# Patient Record
Sex: Female | Born: 1975 | Race: White | Hispanic: No | Marital: Married | Smoking: Former smoker
Health system: Southern US, Community
[De-identification: ages and names within clinical notes are randomized; demographics above are authoritative.]

## PROBLEM LIST (undated history)

## (undated) DIAGNOSIS — Z9109 Other allergy status, other than to drugs and biological substances: Secondary | ICD-10-CM

## (undated) DIAGNOSIS — J45909 Unspecified asthma, uncomplicated: Secondary | ICD-10-CM

## (undated) HISTORY — DX: Morbid (severe) obesity due to excess calories: E66.01

---
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
## 2011-12-13 ENCOUNTER — Encounter: Payer: Self-pay | Admitting: *Deleted

## 2011-12-13 ENCOUNTER — Emergency Department
Admission: EM | Admit: 2011-12-13 | Discharge: 2011-12-13 | Disposition: A | Discharge status: Home / Self Care | Payer: BC Managed Care – PPO | Source: Home / Self Care

## 2011-12-13 DIAGNOSIS — N898 Other specified noninflammatory disorders of vagina: Secondary | ICD-10-CM

## 2011-12-13 DIAGNOSIS — K5900 Constipation, unspecified: Secondary | ICD-10-CM

## 2011-12-13 DIAGNOSIS — Z3200 Encounter for pregnancy test, result unknown: Secondary | ICD-10-CM

## 2011-12-13 DIAGNOSIS — R110 Nausea: Secondary | ICD-10-CM

## 2011-12-13 DIAGNOSIS — R350 Frequency of micturition: Secondary | ICD-10-CM

## 2011-12-13 HISTORY — DX: Unspecified asthma, uncomplicated: J45.909

## 2011-12-13 HISTORY — DX: Other allergy status, other than to drugs and biological substances: Z91.09

## 2011-12-13 LAB — POCT URINE PREGNANCY: Preg Test, Ur: NEGATIVE

## 2011-12-13 LAB — POCT URINALYSIS DIP (MANUAL ENTRY)
Leukocytes, UA: NEGATIVE
Nitrite, UA: NEGATIVE
Protein Ur, POC: NEGATIVE
Urobilinogen, UA: 0.2 (ref 0–1)
pH, UA: 7 (ref 5–8)

## 2011-12-13 NOTE — ED Notes (Signed)
The pt is here today for pregnancy testing. Her LMP was 10/25/11. She c/o nausea, breast tenderness, frequent urination and constipation on and off x 1 wk. Pt reports that she took a home pregnancy test on 12/11/2011 that was negative.

## 2011-12-13 NOTE — ED Provider Notes (Signed)
History     CSN: [id number]  Arrival date & time 12/13/11  4098   None     Chief Complaint  Patient presents with  . Possible Pregnancy      HPI Comments: The pt is here today for pregnancy testing. Her LMP was 10/25/11. She c/o nausea, breast tenderness, frequent urination and constipation on and off x 1 wk. Pt reports that she took a home pregnancy test on 12/11/2011 that was negative.  No pelvic or abdominal pain.  She notes that she had some mild vaginal spotting last night.  Patient is a 36 y.o. female presenting with vaginal bleeding. The history is provided by the patient.  Vaginal Bleeding This is a new problem. The current episode started yesterday. The problem occurs rarely. The problem has been resolved. Associated symptoms comments: none. Nothing aggravates the symptoms. Nothing relieves the symptoms. She has tried nothing for the symptoms.    Past Medical History  Diagnosis Date  . Asthma   . Environmental allergies     Past Surgical History  Procedure Date  . Foot surgery     Family History  Problem Relation Age of Onset  . Fibroids Mother   . Mental illness Father   . Fibroids Sister     History  Substance Use Topics  . Smoking status: Former Games developer  . Smokeless tobacco: Not on file  . Alcohol Use: No    OB History    Grav Para Term Preterm Abortions TAB SAB Ect Mult Living                  Review of Systems  Genitourinary: Positive for vaginal bleeding.  All other systems reviewed and are negative.    Allergies  Review of patient's allergies indicates no known allergies.  Home Medications   Current Outpatient Rx  Name Route Sig Dispense Refill  . ACETAMINOPHEN 325 MG PO TABS Oral Take 650 mg by mouth every 6 (six) hours as needed.    Marland Kitchen DIPHENHYDRAMINE HCL (SLEEP) 25 MG PO TABS Oral Take 25 mg by mouth at bedtime as needed.    Marland Kitchen PRENATAL 27-0.8 MG PO TABS Oral Take 1 tablet by mouth daily.      BP 128/84  Pulse 79  Temp 98.8 F  (37.1 C) (Oral)  Resp 16  Ht 5\' 7"  (1.702 m)  Wt 253 lb (114.76 kg)  BMI 39.63 kg/m2  SpO2 97%  LMP 10/25/2011  Physical Exam Nursing notes and Vital Signs reviewed. Appearance:  Patient appears stated age, and in no acute distress.  Patient is obese (BMI 39.6) Eyes:  Pupils are equal, round, and reactive to light and accomodation.  Extraocular movement is intact.  Conjunctivae are not inflamed  Pharynx:  Normal Neck:  Supple.   No adenopathy or thyromegaly Lungs:  Clear to auscultation.  Breath sounds are equal.  Heart:  Regular rate and rhythm without murmurs, rubs, or gallops.  Abdomen:  Nontender without masses or hepatosplenomegaly.  Bowel sounds are present.  No CVA or flank tenderness.  Extremities:  No edema.  No calf tenderness Skin:  No rash present.   ED Course  Procedures none   Labs Reviewed  POCT URINALYSIS DIP (MANUAL ENTRY) small blood, otherwise negative  POCT URINE PREGNANCY negative  HCG, SERUM, QUALITATIVE   Narrative:    Performed at:  [facility]                [address]  [place], [place] [address]      1. Encounter for pregnancy test       MDM  Will check HCG, serum, qualitative. Followup with Family Doctor or GYN         Lattie Haw, MD 12/15/11 [phone]

## 2011-12-30 ENCOUNTER — Ambulatory Visit (INDEPENDENT_AMBULATORY_CARE_PROVIDER_SITE_OTHER): Payer: BC Managed Care – PPO | Admitting: Obstetrics & Gynecology

## 2011-12-30 ENCOUNTER — Encounter: Payer: Self-pay | Admitting: Obstetrics & Gynecology

## 2011-12-30 VITALS — BP 118/75 | HR 86 | Ht 67.0 in | Wt 253.0 lb

## 2011-12-30 DIAGNOSIS — N915 Oligomenorrhea, unspecified: Secondary | ICD-10-CM

## 2011-12-30 DIAGNOSIS — E669 Obesity, unspecified: Secondary | ICD-10-CM

## 2011-12-30 DIAGNOSIS — N979 Female infertility, unspecified: Secondary | ICD-10-CM

## 2011-12-30 DIAGNOSIS — Z0000 Encounter for general adult medical examination without abnormal findings: Secondary | ICD-10-CM

## 2011-12-30 MED ORDER — DOXYCYCLINE HYCLATE 50 MG PO CAPS: ORAL_CAPSULE | ORAL | Status: DC

## 2011-12-30 NOTE — Patient Instructions (Signed)
Take Doxycycline starting one day before your HSG and continue twice a day for 3 days.  Call our office the day you start your next period, so the HSG can be scheduled.  Hysterosalpingography Hysterosalpingography is an X-ray exam in which a dye is injected into the cervix. This is done to view the inside of the uterus. This may help your caregiver determine whether you have uterine tumors, adhesions, or structural abnormalities. It may also help determine reasons for infertility. Complications from this test can include:  Infection in the lining of the fallopian tubes.   A perforation of the uterus.   An allergic reaction to the dye that is used to perform the X-ray.  PREPARATION FOR TEST No preparation or fasting is needed. However, tell the person performing the test if you think you may be pregnant.  NORMAL FINDINGS  Normal fallopian tubes.   No defects in the uterine cavity.  Ranges for normal findings may vary among different laboratories and hospitals. You should always check with your caregiver after having lab work or other tests done to discuss the meaning of your test results and whether your values are considered within normal limits. MEANING OF TEST  Your caregiver will go over the test results with you. Your caregiver will discuss the importance and meaning of your results. He or she will also discuss treatment options and additional tests, if needed. OBTAINING THE TEST RESULTS It is your responsibility to obtain your test results. Ask the lab or department performing the test when and how you will get your results. Document Released: 05/01/2004 Document Revised: 03/18/2011 Document Reviewed: 02/16/2011 [facility] Patient Information 2012 [place], [place]  .Semen Analysis This is a test used to learn about the health of your reproductive organs, particularly if your partner is having trouble becoming pregnant, or after a vasectomy to determine if the operation was successful.  Semen is the turbid, whitish substance that is released from the penis during ejaculation. Sperm are the cells in semen with a head and a tail that enables them to travel to the egg. A sperm contains one copy of each chromosome (all of the female's genes) and fuses with the female's egg, resulting in fertilization.  PREPARATION FOR TEST A semen sample will be collected in a sterile, wide-mouth container provided by the lab. NORMAL FINDINGS  Volume: 2-5 mL   Liquefaction time: 20 to 30 minutes after collection   pH: 7.12-8.00   Sperm count (density): 50-200 million/mL   Sperm motility: 60% to 80% actively motile   Sperm morphology: 70% to 90% normally shaped  Ranges for normal findings may vary among different laboratories and hospitals. You should always check with your doctor after having lab work or other tests done to discuss the meaning of your test results and whether your values are considered within normal limits. MEANING OF TEST  Your caregiver will go over the test results with you and discuss the importance and meaning of your results, as well as treatment options and the need for additional tests if necessary. OBTAINING THE TEST RESULTS It is your responsibility to obtain your test results. Ask the lab or department performing the test when and how you will get your results. Document Released: 04/23/2004 Document Revised: 03/18/2011 Document Reviewed: 03/10/2008 [facility] Patient Information 2012 [place], [place].

## 2011-12-30 NOTE — Progress Notes (Signed)
  Subjective:    Patient ID: Carrie Gibson, female    DOB: 1975/07/08, 36 y.o.   MRN: [id number]  HPI  Pt presents to establish care and worried that she was 3 weeks late for her last period.  Pt is extremely regular and has only missed the August period.  She has been trying to become pregnant for 6 yrs.  Neither her nor her husband have ever been pregnant.  Pt has not had any medical care.  Need FP and an annual exam.    Review of Systems  Constitutional: Negative.   Respiratory: Negative.   Cardiovascular: Negative.   Genitourinary: Negative.   Musculoskeletal: Negative.        Objective:   Physical Exam  Vitals reviewed. Constitutional: She is oriented to person, place, and time. She appears well-developed and well-nourished. No distress.  HENT:  Head: Normocephalic and atraumatic.  Eyes: Conjunctivae normal are normal.  Pulmonary/Chest: Effort normal.  Neurological: She is alert and oriented to person, place, and time.  Skin: Skin is warm and dry.  Psychiatric: She has a normal mood and affect.          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  Pt would like to proceed with infertility work up. 2-HSG with doxy for prophylaxis, Semen analysis 3-Chol panel, CBC, CMP for health maintenance 4-Refer to Dr. Velva Harman for establishing care 5-PAP and annual exam next visit.

## 2011-12-30 NOTE — Progress Notes (Signed)
Patient is here for irregular cycles, she is not sure when her last pap was.

## 2012-01-10 ENCOUNTER — Ambulatory Visit (INDEPENDENT_AMBULATORY_CARE_PROVIDER_SITE_OTHER): Payer: BC Managed Care – PPO | Admitting: Family Medicine

## 2012-01-10 ENCOUNTER — Encounter: Payer: Self-pay | Admitting: Family Medicine

## 2012-01-10 VITALS — BP 130/85 | HR 78 | Ht 66.0 in | Wt 255.0 lb

## 2012-01-10 DIAGNOSIS — Z8759 Personal history of other complications of pregnancy, childbirth and the puerperium: Secondary | ICD-10-CM | POA: Insufficient documentation

## 2012-01-10 DIAGNOSIS — Z23 Encounter for immunization: Secondary | ICD-10-CM

## 2012-01-10 DIAGNOSIS — Z0000 Encounter for general adult medical examination without abnormal findings: Secondary | ICD-10-CM

## 2012-01-10 DIAGNOSIS — Z8742 Personal history of other diseases of the female genital tract: Secondary | ICD-10-CM

## 2012-01-10 DIAGNOSIS — E669 Obesity, unspecified: Secondary | ICD-10-CM

## 2012-01-10 LAB — CBC
MCH: 29.8 pg (ref 26.0–34.0)
MCHC: 34.3 g/dL (ref 30.0–36.0)
MCV: 86.9 fL (ref 78.0–100.0)
Platelets: 272 10*3/uL (ref 150–400)
RBC: 4.87 MIL/uL (ref 3.87–5.11)
RDW: 12 % (ref 11.5–15.5)

## 2012-01-10 LAB — LIPID PANEL
Cholesterol: 204 mg/dL — ABNORMAL HIGH (ref 0–200)
LDL Cholesterol: 141 mg/dL — ABNORMAL HIGH (ref 0–99)
Total CHOL/HDL Ratio: 6.6 Ratio
VLDL: 32 mg/dL (ref 0–40)

## 2012-01-10 LAB — COMPREHENSIVE METABOLIC PANEL
ALT: 12 U/L (ref 0–35)
Alkaline Phosphatase: 67 U/L (ref 39–117)
CO2: 23 mEq/L (ref 19–32)
Creat: 0.7 mg/dL (ref 0.50–1.10)
Sodium: 140 mEq/L (ref 135–145)
Total Bilirubin: 0.5 mg/dL (ref 0.3–1.2)

## 2012-01-10 NOTE — Progress Notes (Signed)
CC: Carrie Gibson is a 36 y.o. female is here for Establish Care and lab work   Subjective: HPI:  Patient presents to establish care  She states that she and her husband have been trying to get pregnant for matter of years. During this time her periods have been predictable and regular. She describes missing. Back in August in 2-3 weeks at the expected first administration she had heavy bleeding and abnormal lower pelvic resting. She was seen by our GYN colleagues in their suspicion that she may miscarriage. She's not had any vaginal bleeding since this event up until yesterday, character bleeding is similar to past regular periods. She denies shortness of breath, abnormal bleeding histories, chest pain, irregular heartbeat, nor easy bruisability.  She has never had her cholesterol checked. She exercises 3 times a week and tries to watch what she eats.  She is a family history of a benign thyroid tumor but no thyroid abnormalities but she does not believe that her thyroid has ever been checked. She believes one of her grandparents may have diabetes she's never been screened for diabetes from what she knows. She denies polyphagia, polyuria, polydipsia, nor poorly healing wounds.    Review Of Systems Outlined In HPI  Past Medical History  Diagnosis Date  . Asthma   . Environmental allergies      Family History  Problem Relation Age of Onset  . Fibroids Mother   . Mental illness Father   . Fibroids Sister      History  Substance Use Topics  . Smoking status: Former Games developer  . Smokeless tobacco: Not on file  . Alcohol Use: No     Objective: Filed Vitals:   01/10/12 0836  BP: 130/85  Pulse: 78    General: Alert and Oriented, No Acute Distress HEENT: Pupils equal, round, reactive to light. Conjunctivae clear.  External ears unremarkable, canals clear with intact TMs with appropriate landmarks.  Middle ear appears open without effusion. Pink inferior turbinates.  Moist mucous  membranes, pharynx without inflammation nor lesions.  Neck supple without palpable lymphadenopathy nor abnormal masses. Lungs: Clear to auscultation bilaterally, no wheezing/ronchi/rales.  Comfortable work of breathing. Good air movement. Cardiac: Regular rate and rhythm. Normal S1/S2.  No murmurs, rubs, nor gallops.   Extremities: No peripheral edema.  Strong peripheral pulses.  Mental Status: No depression, anxiety, nor agitation. Skin: Warm and dry.  Assessment & Plan: Kamelia was seen today for establish care and lab work.  Diagnoses and associated orders for this visit:  Obesity - Comp Met (CMET) - Lipid panel - TSH  History of miscarriage - CBC  Routine health maintenance - Comp Met (CMET) - Lipid panel - TSH - Hepatitis A vaccine adult IM    Screen for dyslipidemia, screen for diabetes, screen for thyroid abnormalities, screen for metabolic abnormalities given recent history possible miscarriage. CBC given recent abnormal menstruation, checking to insure platelets are appropriate and not anemic. She declines flu shot today but believes she's never had hepatitis A vaccine and specifically requests that. Follow up will be based on above results at least within 6 months for #2 hepatitis a.  She's continue infertility workup with our GYN colleagues.  Return in about 6 months (around 07/09/2012).

## 2012-01-11 ENCOUNTER — Encounter: Payer: Self-pay | Admitting: Family Medicine

## 2012-01-17 ENCOUNTER — Encounter: Payer: Self-pay | Admitting: Obstetrics & Gynecology

## 2012-01-17 ENCOUNTER — Ambulatory Visit (HOSPITAL_COMMUNITY)
Admission: RE | Admit: 2012-01-17 | Discharge: 2012-01-17 | Disposition: A | Discharge status: Home / Self Care | Payer: BC Managed Care – PPO | Source: Ambulatory Visit | Attending: Obstetrics & Gynecology | Admitting: Obstetrics & Gynecology

## 2012-01-17 DIAGNOSIS — N979 Female infertility, unspecified: Secondary | ICD-10-CM | POA: Insufficient documentation

## 2012-01-17 MED ORDER — IOHEXOL 300 MG/ML  SOLN
10.0000 mL | Freq: Once | INTRAMUSCULAR | Status: AC | PRN
  Administered 2012-01-17: 10 mL

## 2012-04-13 ENCOUNTER — Encounter: Payer: Self-pay | Admitting: Physician Assistant

## 2012-04-13 ENCOUNTER — Ambulatory Visit (INDEPENDENT_AMBULATORY_CARE_PROVIDER_SITE_OTHER): Payer: BC Managed Care – PPO | Admitting: Physician Assistant

## 2012-04-13 VITALS — BP 143/88 | HR 83 | Temp 98.6°F | Resp 18 | Wt 201.0 lb

## 2012-04-13 DIAGNOSIS — J45901 Unspecified asthma with (acute) exacerbation: Secondary | ICD-10-CM | POA: Insufficient documentation

## 2012-04-13 DIAGNOSIS — J40 Bronchitis, not specified as acute or chronic: Secondary | ICD-10-CM

## 2012-04-13 DIAGNOSIS — J45909 Unspecified asthma, uncomplicated: Secondary | ICD-10-CM | POA: Insufficient documentation

## 2012-04-13 MED ORDER — ALBUTEROL SULFATE HFA 108 (90 BASE) MCG/ACT IN AERS: 2.0000 | INHALATION_SPRAY | Freq: Four times a day (QID) | RESPIRATORY_TRACT | Status: DC | PRN

## 2012-04-13 MED ORDER — AZITHROMYCIN 250 MG PO TABS: ORAL_TABLET | ORAL | Status: DC

## 2012-04-13 MED ORDER — PREDNISONE 20 MG PO TABS: 20.0000 mg | ORAL_TABLET | Freq: Every day | ORAL | Status: DC

## 2012-04-13 MED ORDER — PREDNISONE 20 MG PO TABS: 20.0000 mg | ORAL_TABLET | Freq: Two times a day (BID) | ORAL | Status: DC

## 2012-04-13 NOTE — Patient Instructions (Addendum)

## 2012-04-13 NOTE — Progress Notes (Signed)
  Subjective:    Patient ID: Carrie Gibson, female    DOB: Jul 30, 1975, 37 y.o.   MRN: [id number]  HPI Patient presents to the clinic with cough, SOB, and wheezing for 4-5 days. She has a hx of asthma but has not needed treatment over the past years. Her chest really feels tight. She has a productive cough. She denies any fever, chills, muscle aches, N/V/D. She has some ear congestion but denies any ST. She does have some sinus congestion. She has tried OTC inhaler medication and decongestants. She feels very out of breath.   Review of Systems     Objective:   Physical Exam  Constitutional: She is oriented to person, place, and time. She appears well-developed and well-nourished.  HENT:  Head: Normocephalic and atraumatic.  Right Ear: External ear normal.  Left Ear: External ear normal.  Nose: Nose normal.  Mouth/Throat: Oropharynx is clear and moist.       TM's clear bilaterally.   Negative for any sinus pressure.   Eyes: Conjunctivae normal are normal.  Neck: Normal range of motion. Neck supple.  Cardiovascular: Normal rate, regular rhythm and normal heart sounds.   Pulmonary/Chest:       Decreased effort with deep breaths. Did hear inspiratory wheezing at apex of both lungs. Not able to finish complete sentences without taking breaths in between.   Lymphadenopathy:    She has no cervical adenopathy.  Neurological: She is alert and oriented to person, place, and time.  Skin: Skin is warm and dry.  Psychiatric: She has a normal mood and affect. Her behavior is normal.          Assessment & Plan:  Bronchitis with asthma component- Gave neb in office. Pt admitted to feeling better after neb. Gave prednisone for 5 days. Gave inhaler to use as needed up to every 6 hours. Discussed with patient if still needed neb after 2 weeks need to be seen in office to discuss daily ICS. Gave zpak to start.symptomatic care discussed.

## 2012-04-14 ENCOUNTER — Ambulatory Visit: Payer: BC Managed Care – PPO | Admitting: Physician Assistant

## 2012-05-27 ENCOUNTER — Other Ambulatory Visit: Payer: Self-pay

## 2012-05-29 ENCOUNTER — Ambulatory Visit (INDEPENDENT_AMBULATORY_CARE_PROVIDER_SITE_OTHER): Payer: BC Managed Care – PPO | Admitting: Family Medicine

## 2012-05-29 VITALS — BP 145/83 | HR 102 | Temp 98.1°F | Wt 265.0 lb

## 2012-05-29 DIAGNOSIS — J209 Acute bronchitis, unspecified: Secondary | ICD-10-CM

## 2012-05-29 DIAGNOSIS — J45901 Unspecified asthma with (acute) exacerbation: Secondary | ICD-10-CM

## 2012-05-29 MED ORDER — MOMETASONE FUROATE 220 MCG/INH IN AEPB: 1.0000 | INHALATION_SPRAY | Freq: Every day | RESPIRATORY_TRACT | Status: DC

## 2012-05-29 MED ORDER — CEFDINIR 300 MG PO CAPS: 300.0000 mg | ORAL_CAPSULE | Freq: Two times a day (BID) | ORAL | Status: DC

## 2012-05-29 MED ORDER — ALBUTEROL SULFATE (2.5 MG/3ML) 0.083% IN NEBU: 2.5000 mg | INHALATION_SOLUTION | Freq: Four times a day (QID) | RESPIRATORY_TRACT | Status: DC | PRN

## 2012-05-29 MED ORDER — PREDNISONE 20 MG PO TABS: ORAL_TABLET | ORAL | Status: AC

## 2012-05-29 NOTE — Progress Notes (Signed)
CC: Carrie Gibson is a 37 y.o. female is here for Nasal Congestion and Cough   Subjective: HPI:  Patient complains of shortness of breath and wheezing for the past 4-5 days. For the past 48 hours she has felt subjective fevers and chills. Temperature at home yesterday was 100.0. She describes her shortness of breath has trouble breathing even when resting and speaking full sentences. Anything with more exertion worsening shortness of breath during.  All her symptoms seem to have started after she gave her daughter back which has caused shortness of breath and worsening asthma in the past. Shortness of breath and wheezing are somewhat improved with albuterol home. Nothing else makes symptoms better or worse. She recalls using a controller inhaler years ago but none recently.  Symptoms are present 24 hours a day, do not interfere asleep. She describes her overall constellation of symptoms as moderate in severity. She denies confusion, headache, motor sensory disturbances, chest pain, nausea, vomiting, orthopnea, nor peripheral edema.  Review Of Systems Outlined In HPI  Past Medical History  Diagnosis Date  . Asthma   . Environmental allergies      Family History  Problem Relation Age of Onset  . Fibroids Mother   . Mental illness Father   . Fibroids Sister      History  Substance Use Topics  . Smoking status: Former Games developer  . Smokeless tobacco: Not on file  . Alcohol Use: No     Objective: Filed Vitals:   05/29/12 1346  BP: 145/83  Pulse: 102  Temp: 98.1 F (36.7 C)    General: Alert and Oriented, No Acute Distress HEENT: Pupils equal, round, reactive to light. Conjunctivae clear.  External ears unremarkable, canals clear with intact TMs with appropriate landmarks.  Middle ear appears open without effusion. Pink inferior turbinates.  Moist mucous membranes, pharynx without inflammation nor lesions.  Neck supple without palpable lymphadenopathy nor abnormal masses. Lungs: Mild  wheezing in all lung fields without rhonchi nor rales. No signs of consolidation. Patient speaking comfortably with full sentences Cardiac: Regular rate and rhythm. Normal S1/S2.  No murmurs, rubs, nor gallops.   Extremities: No peripheral edema.  Strong peripheral pulses.  Mental Status: No depression, anxiety, nor agitation. Skin: Warm and dry.  Assessment & Plan: Carrie Gibson was seen today for nasal congestion and cough.  Diagnoses and associated orders for this visit:  Acute bronchitis - cefdinir (OMNICEF) 300 MG capsule; Take 1 capsule (300 mg total) by mouth 2 (two) times daily. - predniSONE (DELTASONE) 20 MG tablet; Three tabs at once daily for five days. - albuterol (PROVENTIL) (2.5 MG/3ML) 0.083% nebulizer solution; Take 3 mLs (2.5 mg total) by nebulization every 6 (six) hours as needed for wheezing.  Asthma with acute exacerbation - albuterol (PROVENTIL) (2.5 MG/3ML) 0.083% nebulizer solution; Take 3 mLs (2.5 mg total) by nebulization every 6 (six) hours as needed for wheezing. - mometasone (ASMANEX) 220 MCG/INH inhaler; Inhale 1 puff into the lungs daily.    Asthma exacerbation: Likely do to dog dander exposure and viral/bacterial bronchitis. I like her to start prednisone and either azithromycin or doxycycline. She refuses azithromycin and is currently trying to get pregnant therefore will treat with second line therapy of Omnicef. She has a nebulizer at home was provided with albuterol. Her symptoms were greatly improved subjectively and objectively after duoneb administration.  She was given Asmanex sample and encouraged to start this while finishing prednisone. Call me if helping any baseline shortness of breathing and I will call  in a refill.  25 minutes spent face-to-face during visit today of which at least 50% was counseling or coordinating care regarding asthma and acute bronchitis.   Return if symptoms worsen or fail to improve.

## 2012-06-16 ENCOUNTER — Encounter: Payer: Self-pay | Admitting: Family Medicine

## 2012-06-16 DIAGNOSIS — J45909 Unspecified asthma, uncomplicated: Secondary | ICD-10-CM

## 2012-06-19 MED ORDER — MOMETASONE FUROATE 220 MCG/INH IN AEPB: 1.0000 | INHALATION_SPRAY | Freq: Every day | RESPIRATORY_TRACT | Status: DC

## 2012-06-19 NOTE — Telephone Encounter (Signed)
Carrie Gibson, Will you please let Carrie Gibson know that i've sent in refills on the asmanex inhaler to her pharmacy. I'd like her to follow up in one month.

## 2012-08-31 ENCOUNTER — Telehealth: Payer: Self-pay | Admitting: Family Medicine

## 2012-08-31 DIAGNOSIS — J45909 Unspecified asthma, uncomplicated: Secondary | ICD-10-CM

## 2012-08-31 MED ORDER — BECLOMETHASONE DIPROPIONATE 80 MCG/ACT IN AERS: INHALATION_SPRAY | RESPIRATORY_TRACT | Status: DC

## 2012-08-31 NOTE — Telephone Encounter (Signed)
Left message on vm

## 2012-08-31 NOTE — Telephone Encounter (Signed)
Sue Lush, Will you please let Mrs. Walthall know that her medication insurance plan will not cover asthmanex.  This is not that big of a deal since there are great alternatives that will work just as well. (QVAR/ Flovent HFA/Diskus, Pulmicort).  I have sent a QVAR rx to [facility] on [address] as a substitute.

## 2012-10-31 ENCOUNTER — Telehealth: Payer: Self-pay | Admitting: Family Medicine

## 2012-10-31 DIAGNOSIS — J309 Allergic rhinitis, unspecified: Secondary | ICD-10-CM

## 2012-10-31 MED ORDER — MOMETASONE FUROATE 50 MCG/ACT NA SUSP: NASAL | Status: DC

## 2012-10-31 NOTE — Telephone Encounter (Signed)
nasacort no help

## 2013-01-15 ENCOUNTER — Emergency Department
Admission: EM | Admit: 2013-01-15 | Discharge: 2013-01-15 | Disposition: A | Discharge status: Home / Self Care | Payer: BC Managed Care – PPO | Source: Home / Self Care | Attending: Family Medicine | Admitting: Family Medicine

## 2013-01-15 DIAGNOSIS — J069 Acute upper respiratory infection, unspecified: Secondary | ICD-10-CM

## 2013-01-15 DIAGNOSIS — J029 Acute pharyngitis, unspecified: Secondary | ICD-10-CM

## 2013-01-15 MED ORDER — PREDNISONE 50 MG PO TABS: ORAL_TABLET | ORAL | Status: DC

## 2013-01-15 NOTE — ED Notes (Signed)
Patient complains of sore throat, congestion, hoarseness and bilateral ear pain for 2 days.

## 2013-01-15 NOTE — ED Provider Notes (Addendum)
CSN: [id number]     Arrival date & time 01/15/13  1706 History   First MD Initiated Contact with Patient 01/15/13 1723     No chief complaint on file.   HPI  URI Symptoms Onset: 2 days  Description: rhinorrhea, nasal congestion, ?fluid in ears  Modifying factors:  + strep exposure in niece  Symptoms Nasal discharge: yes Fever: no Sore throat: yes Cough: no Wheezing: no Ear pain: no GI symptoms: no Sick contacts: yes  Red Flags  Stiff neck: no Dyspnea: no Rash: no Swallowing difficulty: no  Sinusitis Risk Factors Headache/face pain: no Double sickening: no tooth pain: no  Allergy Risk Factors Sneezing: no Itchy scratchy throat: no Seasonal symptoms: no  Flu Risk Factors Headache: no muscle aches: no severe fatigue: no   Past Medical History  Diagnosis Date  . Asthma   . Environmental allergies    Past Surgical History  Procedure Laterality Date  . Foot surgery  1995   Family History  Problem Relation Age of Onset  . Fibroids Mother   . Mental illness Father   . Fibroids Sister    History  Substance Use Topics  . Smoking status: Former Games developer  . Smokeless tobacco: Not on file  . Alcohol Use: No   OB History   Grav Para Term Preterm Abortions TAB SAB Ect Mult Living                 Review of Systems  All other systems reviewed and are negative.    Allergies  Review of patient's allergies indicates no known allergies.  Home Medications   Current Outpatient Rx  Name  Route  Sig  Dispense  Refill  . albuterol (PROVENTIL HFA;VENTOLIN HFA) 108 (90 BASE) MCG/ACT inhaler   Inhalation   Inhale 2 puffs into the lungs every 6 (six) hours as needed for wheezing.   1 Inhaler   2   . albuterol (PROVENTIL) (2.5 MG/3ML) 0.083% nebulizer solution   Nebulization   Take 3 mLs (2.5 mg total) by nebulization every 6 (six) hours as needed for wheezing.   75 mL   1   . beclomethasone (QVAR) 80 MCG/ACT inhaler      Two puffs inhaled twice a day to  prevent asthma symptoms.   7.3 g   12   . cefdinir (OMNICEF) 300 MG capsule   Oral   Take 1 capsule (300 mg total) by mouth 2 (two) times daily.   20 capsule   0   . cetirizine (ZYRTEC) 10 MG tablet   Oral   Take 10 mg by mouth daily.         . mometasone (NASONEX) 50 MCG/ACT nasal spray      Two sprays each nostril daily.   17 g   2    There were no vitals taken for this visit. Physical Exam  Constitutional:  Obese    HENT:  Head: Normocephalic and atraumatic.  Right Ear: External ear normal.  Left Ear: External ear normal.  Mouth/Throat: No oropharyngeal exudate.  +nasal erythema, rhinorrhea bilaterally, + post oropharyngeal erythema    Eyes: Conjunctivae are normal. Pupils are equal, round, and reactive to light.  Neck: Normal range of motion. Neck supple.  Cardiovascular: Normal rate, regular rhythm and normal heart sounds.   Pulmonary/Chest: Effort normal and breath sounds normal.  Abdominal: Soft.  Musculoskeletal: Normal range of motion.  Lymphadenopathy:    She has cervical adenopathy.  Neurological: She is alert.  Skin: Skin  is warm.    ED Course  Procedures (including critical care time) Labs Review Labs Reviewed - No data to display Imaging Review No results found.  MDM   1. URI (upper respiratory infection)    Likley viral process Rapid strep negative.  No wheezing today.  Lower threshold for prednisone if needed.  Will call this in as ppx measure.  Strep culture.  Discussed supportive care and infectious red flags.      The patient and/or caregiver has been counseled thoroughly with regard to treatment plan and/or medications prescribed including dosage, schedule, interactions, rationale for use, and possible side effects and they verbalize understanding. Diagnoses and expected course of recovery discussed and will return if not improved as expected or if the condition worsens. Patient and/or caregiver verbalized understanding.           Doree Albee, MD 01/15/13 1757  Doree Albee, MD 01/15/13 Windy Fast

## 2013-01-16 ENCOUNTER — Encounter: Payer: Self-pay | Admitting: Family Medicine

## 2013-01-17 LAB — STREP A DNA PROBE: GASP: NEGATIVE

## 2013-01-18 ENCOUNTER — Telehealth: Payer: Self-pay | Admitting: *Deleted

## 2013-01-24 ENCOUNTER — Encounter: Payer: Self-pay | Admitting: Family Medicine

## 2013-02-15 ENCOUNTER — Other Ambulatory Visit: Payer: Self-pay

## 2013-03-09 ENCOUNTER — Encounter: Payer: Self-pay | Admitting: Family Medicine

## 2013-03-12 MED ORDER — ALBUTEROL SULFATE HFA 108 (90 BASE) MCG/ACT IN AERS: INHALATION_SPRAY | RESPIRATORY_TRACT | Status: DC

## 2013-05-28 ENCOUNTER — Encounter: Payer: Self-pay | Admitting: Family Medicine

## 2013-05-28 ENCOUNTER — Ambulatory Visit (INDEPENDENT_AMBULATORY_CARE_PROVIDER_SITE_OTHER): Payer: BC Managed Care – PPO | Admitting: Family Medicine

## 2013-05-28 VITALS — BP 133/84 | HR 78 | Ht 66.0 in | Wt 282.0 lb

## 2013-05-28 DIAGNOSIS — J45909 Unspecified asthma, uncomplicated: Secondary | ICD-10-CM

## 2013-05-28 DIAGNOSIS — Z23 Encounter for immunization: Secondary | ICD-10-CM

## 2013-05-28 DIAGNOSIS — Z0289 Encounter for other administrative examinations: Secondary | ICD-10-CM

## 2013-05-28 MED ORDER — ALBUTEROL SULFATE HFA 108 (90 BASE) MCG/ACT IN AERS: 2.0000 | INHALATION_SPRAY | Freq: Four times a day (QID) | RESPIRATORY_TRACT | Status: DC | PRN

## 2013-05-28 MED ORDER — BECLOMETHASONE DIPROPIONATE 80 MCG/ACT IN AERS: INHALATION_SPRAY | RESPIRATORY_TRACT | Status: DC

## 2013-05-28 NOTE — Progress Notes (Signed)
CC: Carrie Gibson is a 38 y.o. female is here for form completed for work   Subjective: HPI:  Recently became employed in a teaching position with [employer] requesting health form to be filled out today. She reports that she received MMR vaccine as a child and then again in college. She has not had the flu shot this year she is 10 years out since her last tetanus booster. She is never been exposed to tuberculosis that she knows of. She had a normal PPD 3 years ago since then denies any new cough, unintentional weight loss, night sweats, nor ever having blood in sputum. She has never been any high risk tuberculosis exposure setting.  Her only chronic illnesses asthma she is currently using Qvar on a daily basis and uses her rescue inhaler only if she is battling a virus or has been exposed to tomatoes.    Denies fevers, chills, shortness of breath, wheezing, rash, weakness, joint or muscle pain   Review Of Systems Outlined In HPI  Past Medical History  Diagnosis Date  . Asthma   . Environmental allergies     Past Surgical History  Procedure Laterality Date  . Foot surgery  1995   Family History  Problem Relation Age of Onset  . Fibroids Mother   . Mental illness Father   . Fibroids Sister     History   Social History  . Marital Status: Married    Spouse Name: N/A    Number of Children: N/A  . Years of Education: N/A   Occupational History  . Not on file.   Social History Main Topics  . Smoking status: Former Research scientist (life sciences)  . Smokeless tobacco: Not on file  . Alcohol Use: No  . Drug Use: No  . Sexual Activity:    Other Topics Concern  . Not on file   Social History Narrative  . No narrative on file     Objective: BP 133/84  Pulse 78  Ht _0  (1.676 m)  Wt 282 lb (127.914 kg)  BMI 45.54 kg/m2  General: Alert and Oriented, No Acute Distress HEENT: Pupils equal, round, reactive to light. Conjunctivae clear.  External ears unremarkable, canals clear with  intact TMs with appropriate landmarks.  Middle ear appears open without effusion. Pink inferior turbinates.  Moist mucous membranes, pharynx without inflammation nor lesions.  Neck supple without palpable lymphadenopathy nor abnormal masses. Lungs: Clear to auscultation bilaterally, no wheezing/ronchi/rales.  Comfortable work of breathing. Good air movement. Cardiac: Regular rate and rhythm. Normal S1/S2.  No murmurs, rubs, nor gallops.   Extremities: No peripheral edema.  Strong peripheral pulses.  Mental Status: No depression, anxiety, nor agitation. Skin: Warm and dry.  Assessment & Plan: Carrie Gibson was seen today for form completed for work.  Diagnoses and associated orders for this visit:  Asthma, chronic - beclomethasone (QVAR) 80 MCG/ACT inhaler; Two puffs inhaled twice a day to prevent asthma symptoms.  Examination, physical, employee  Other Orders - albuterol (PROVENTIL HFA;VENTOLIN HFA) 108 (90 BASE) MCG/ACT inhaler; Inhale 2 puffs into the lungs every 6 (six) hours as needed for wheezing.    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   Return if symptoms worsen or fail to improve.

## 2013-05-28 NOTE — Addendum Note (Signed)
Addended by: Isaias Cowman C on: 05/28/2013 11:35 AM   Modules accepted: Orders

## 2013-05-30 ENCOUNTER — Encounter: Payer: Self-pay | Admitting: Family Medicine

## 2013-05-30 ENCOUNTER — Ambulatory Visit (INDEPENDENT_AMBULATORY_CARE_PROVIDER_SITE_OTHER): Payer: BC Managed Care – PPO | Admitting: Family Medicine

## 2013-05-30 VITALS — BP 131/84 | HR 77 | Temp 97.8°F | Wt 282.0 lb

## 2013-05-30 DIAGNOSIS — J45901 Unspecified asthma with (acute) exacerbation: Secondary | ICD-10-CM

## 2013-05-30 DIAGNOSIS — J45909 Unspecified asthma, uncomplicated: Secondary | ICD-10-CM

## 2013-05-30 MED ORDER — PREDNISONE 20 MG PO TABS: ORAL_TABLET | ORAL | Status: AC

## 2013-05-30 MED ORDER — AZITHROMYCIN 250 MG PO TABS: ORAL_TABLET | ORAL | Status: AC

## 2013-05-30 MED ORDER — BENZONATATE 100 MG PO CAPS: 100.0000 mg | ORAL_CAPSULE | Freq: Three times a day (TID) | ORAL | Status: DC | PRN

## 2013-05-30 NOTE — Progress Notes (Signed)
CC: Carrie Gibson is a 38 y.o. female is here for cough and congestion   Subjective: HPI:  Complains of productive cough that has been present since late last night with acute onset moderate in severity persistent causing shortness of breath of moderate severity. Slightly improves with albuterol use. Has not been getting better or worse since onset. Denies choking or known aspiration.  Denies fevers, chills, confusion, wheezing, chest pain, nasal congestion or sinus pressure. Symptoms are persistent throughout the day   Review Of Systems Outlined In HPI  Past Medical History  Diagnosis Date  . Asthma   . Environmental allergies     Past Surgical History  Procedure Laterality Date  . Foot surgery  1995   Family History  Problem Relation Age of Onset  . Fibroids Mother   . Mental illness Father   . Fibroids Sister     History   Social History  . Marital Status: Married    Spouse Name: N/A    Number of Children: N/A  . Years of Education: N/A   Occupational History  . Not on file.   Social History Main Topics  . Smoking status: Former Research scientist (life sciences)  . Smokeless tobacco: Not on file  . Alcohol Use: No  . Drug Use: No  . Sexual Activity:    Other Topics Concern  . Not on file   Social History Narrative  . No narrative on file     Objective: BP 131/84  Pulse 77  Temp(Src) 97.8 F (36.6 C) (Oral)  Wt 282 lb (127.914 kg)  General: Alert and Oriented, No Acute Distress HEENT: Pupils equal, round, reactive to light. Conjunctivae clear.  External ears unremarkable, canals clear with intact TMs with appropriate landmarks.  Middle ear appears open without effusion. Pink inferior turbinates.  Moist mucous membranes, pharynx without inflammation nor lesions.  Neck supple without palpable lymphadenopathy nor abnormal masses. Lungs: Clear to auscultation bilaterally, no wheezing/ronchi/rales.  Comfortable work of breathing. Good air movement. Frequent coughing Cardiac: Regular rate  and rhythm. Normal S1/S2.  No murmurs, rubs, nor gallops.   Mental Status: No depression, anxiety, nor agitation. Skin: Warm and dry.  Assessment & Plan: Carrie Gibson was seen today for cough and congestion.  Diagnoses and associated orders for this visit:  Asthma with acute exacerbation - predniSONE (DELTASONE) 20 MG tablet; Three tabs at once daily for five days. - benzonatate (TESSALON PERLES) 100 MG capsule; Take 1 capsule (100 mg total) by mouth 3 (three) times daily as needed for cough.  Asthma  Other Orders - azithromycin (ZITHROMAX) 250 MG tablet; Take two tabs at once on day 1, then one tab daily on days 2-5.    Asthma exacerbation due to viral respiratory illness start prednisone and Tessalon Perles if no improvement after 48 hours start azithromycin however no clear indication to have this filled at this time   Return if symptoms worsen or fail to improve.

## 2013-07-17 ENCOUNTER — Encounter: Payer: Self-pay | Admitting: Family Medicine

## 2013-07-17 ENCOUNTER — Ambulatory Visit (INDEPENDENT_AMBULATORY_CARE_PROVIDER_SITE_OTHER): Payer: BC Managed Care – PPO | Admitting: Family Medicine

## 2013-07-17 VITALS — BP 137/88 | HR 82 | Wt 288.0 lb

## 2013-07-17 DIAGNOSIS — B9689 Other specified bacterial agents as the cause of diseases classified elsewhere: Secondary | ICD-10-CM

## 2013-07-17 DIAGNOSIS — H1089 Other conjunctivitis: Secondary | ICD-10-CM

## 2013-07-17 DIAGNOSIS — J309 Allergic rhinitis, unspecified: Secondary | ICD-10-CM

## 2013-07-17 DIAGNOSIS — A499 Bacterial infection, unspecified: Secondary | ICD-10-CM

## 2013-07-17 DIAGNOSIS — H109 Unspecified conjunctivitis: Secondary | ICD-10-CM

## 2013-07-17 MED ORDER — MOMETASONE FUROATE 50 MCG/ACT NA SUSP: NASAL | Status: DC

## 2013-07-17 MED ORDER — POLYMYXIN B-TRIMETHOPRIM 10000-0.1 UNIT/ML-% OP SOLN: 2.0000 [drp] | OPHTHALMIC | Status: DC

## 2013-07-17 NOTE — Progress Notes (Signed)
CC: Carrie Gibson is a 38 y.o. female is here for red irritated eye   Subjective: HPI:  Left eye pain and redness for the past 36 hours came on gradually has not been getting better or worse throughout the day. Pain described as itching mild to moderate in severity nothing particularly makes better or worse. She has tried allergy eye drops without any improvement of her discomfort. Denies pain with blinking or movement of the eye. Denies photophobia. Denies vision loss. The eye was matted shut this morning.  Pain is localized only to lateral aspect of it. She denies known exposure to airborne foreign bodies. Denies fevers, chills, nor headache   Review Of Systems Outlined In HPI  Past Medical History  Diagnosis Date  . Asthma   . Environmental allergies     Past Surgical History  Procedure Laterality Date  . Foot surgery  1995   Family History  Problem Relation Age of Onset  . Fibroids Mother   . Mental illness Father   . Fibroids Sister     History   Social History  . Marital Status: Married    Spouse Name: N/A    Number of Children: N/A  . Years of Education: N/A   Occupational History  . Not on file.   Social History Main Topics  . Smoking status: Former Research scientist (life sciences)  . Smokeless tobacco: Not on file  . Alcohol Use: No  . Drug Use: No  . Sexual Activity:    Other Topics Concern  . Not on file   Social History Narrative  . No narrative on file     Objective: BP 137/88  Pulse 82  Wt 288 lb (130.636 kg)  General: Alert and Oriented, No Acute Distress HEENT: Pupils equal, round, reactive to light. Right Conjunctivae clear. Left conjunctival erythema on the lateral aspect of the eye medial aspect is completely unremarkable. No foreign body underneath left eyelids. Fluorescein staining under Wood's lamp unremarkable. Anterior chamber on both eyes appears open without debris. External ears unremarkable, canals clear with intact TMs with appropriate landmarks.  Middle ear  appears open without effusion. Pink inferior turbinates.  Moist mucous membranes, pharynx without inflammation nor lesions.  Neck supple without palpable lymphadenopathy nor abnormal masses. Lungs: Clear and comfortable work of breathing Cardiac: Regular rate and rhythm. Extremities: No peripheral edema.  Strong peripheral pulses.  Mental Status: No depression, anxiety, nor agitation. Skin: Warm and dry.  Assessment & Plan: Carrie Gibson was seen today for red irritated eye.  Diagnoses and associated orders for this visit:  Bacterial conjunctivitis of left eye - trimethoprim-polymyxin b (POLYTRIM) ophthalmic solution; Place 2 drops into the left eye every 4 (four) hours. Ten Days  Allergic rhinitis - mometasone (NASONEX) 50 MCG/ACT nasal spray; Two sprays each nostril daily.    Bacterial conjunctivitis: Start Polytrim fortunately vision is unremarkable and the same compared to February. Discussed contagious nature of illness.Signs and symptoms requring emergent/urgent reevaluation were discussed with the patient. Should any decline occur she needs urgent ophthalmology referral  Return if symptoms worsen or fail to improve.

## 2013-09-11 ENCOUNTER — Encounter: Payer: Self-pay | Admitting: Family Medicine

## 2013-09-12 ENCOUNTER — Telehealth: Payer: Self-pay | Admitting: Family Medicine

## 2013-09-12 DIAGNOSIS — J45909 Unspecified asthma, uncomplicated: Secondary | ICD-10-CM

## 2013-09-12 MED ORDER — ALBUTEROL SULFATE HFA 108 (90 BASE) MCG/ACT IN AERS: 2.0000 | INHALATION_SPRAY | Freq: Four times a day (QID) | RESPIRATORY_TRACT | Status: DC | PRN

## 2013-09-12 NOTE — Telephone Encounter (Signed)
Refill req 

## 2014-02-14 ENCOUNTER — Other Ambulatory Visit: Payer: Self-pay | Admitting: Family Medicine

## 2014-04-24 ENCOUNTER — Encounter: Payer: Self-pay | Admitting: Family Medicine

## 2014-04-24 ENCOUNTER — Ambulatory Visit (INDEPENDENT_AMBULATORY_CARE_PROVIDER_SITE_OTHER): Payer: BC Managed Care – PPO | Admitting: Family Medicine

## 2014-04-24 VITALS — BP 143/79 | HR 84 | Temp 97.7°F | Wt 291.0 lb

## 2014-04-24 DIAGNOSIS — A499 Bacterial infection, unspecified: Secondary | ICD-10-CM

## 2014-04-24 DIAGNOSIS — B9689 Other specified bacterial agents as the cause of diseases classified elsewhere: Secondary | ICD-10-CM

## 2014-04-24 DIAGNOSIS — J329 Chronic sinusitis, unspecified: Secondary | ICD-10-CM

## 2014-04-24 MED ORDER — CEFDINIR 300 MG PO CAPS
300.0000 mg | ORAL_CAPSULE | Freq: Two times a day (BID) | ORAL | Status: AC
Start: 2014-04-24 — End: 2014-05-04

## 2014-04-24 NOTE — Progress Notes (Signed)
CC: Carrie Gibson is a 39 y.o. female is here for Nasal Congestion   Subjective: HPI:  Productive cough with postnasal drip but hasn't present since Thanksgiving seems to be coming and going throughout the week nothing particularly makes it better or worse. Over the past week she's had sinus pressure in both cheeks that has not been responsive to Tylenol. No other interventions as of yet other than continuing albuterol and Qvar due to fears that over-the-counter medications may interfere with her IVF procedures that she is currently going through. Describes occasional wheezing which is improved by albuterol. Symptoms overall are moderate in severity right now present all hours of the day. Denies fevers or chills but endorses fatigue. Denies chest pain shortness of breath myalgias.   Review Of Systems Outlined In HPI  Past Medical History  Diagnosis Date  . Asthma   . Environmental allergies     Past Surgical History  Procedure Laterality Date  . Foot surgery  1995   Family History  Problem Relation Age of Onset  . Fibroids Mother   . Mental illness Father   . Fibroids Sister     History   Social History  . Marital Status: Married    Spouse Name: N/A    Number of Children: N/A  . Years of Education: N/A   Occupational History  . Not on file.   Social History Main Topics  . Smoking status: Former Research scientist (life sciences)  . Smokeless tobacco: Not on file  . Alcohol Use: No  . Drug Use: No  . Sexual Activity: Not on file   Other Topics Concern  . Not on file   Social History Narrative     Objective: BP 143/79 mmHg  Pulse 84  Temp(Src) 97.7 F (36.5 C) (Oral)  Wt 291 lb (131.997 kg)  General: Alert and Oriented, No Acute Distress HEENT: Pupils equal, round, reactive to light. Conjunctivae clear.  External ears unremarkable, canals clear with intact TMs with appropriate landmarks.  Middle ear appears open without effusion. Boggy erythematous inferior turbinates with moderate mucoid  discharge.  Moist mucous membranes, pharynx without lesions however moderately erythematous and moderate postnasal drip.  Neck supple without palpable lymphadenopathy nor abnormal masses. Lungs: Clear to auscultation bilaterally, no wheezing/ronchi/rales.  Comfortable work of breathing. Good air movement. Extremities: No peripheral edema.  Strong peripheral pulses.  Mental Status: No depression, anxiety, nor agitation. Skin: Warm and dry.  Assessment & Plan: Carrie Gibson was seen today for nasal congestion.  Diagnoses and associated orders for this visit:  Bacterial sinusitis - cefdinir (OMNICEF) 300 MG capsule; Take 1 capsule (300 mg total) by mouth 2 (two) times daily.    Bacterial sinusitis: Given that she could be pregnant right now advised azithromycin due to favorable safety profile and pregnancy, she tells me that it gives her side effects that are intolerable and she would prefer something else. Recommend Omnicef given that this is also class B for pregnancy similar to azithromycin. I would like to give her steroids however do not want to interfere with her IVF situation at this time it does not seem that steroids are 100% necessary.  Return if symptoms worsen or fail to improve.

## 2014-06-03 ENCOUNTER — Other Ambulatory Visit: Payer: Self-pay | Admitting: Family Medicine

## 2014-06-04 ENCOUNTER — Encounter: Payer: Self-pay | Admitting: Family Medicine

## 2014-06-07 ENCOUNTER — Other Ambulatory Visit: Payer: Self-pay | Admitting: Family

## 2014-06-07 ENCOUNTER — Encounter: Payer: Self-pay | Admitting: Family

## 2014-06-07 ENCOUNTER — Ambulatory Visit (INDEPENDENT_AMBULATORY_CARE_PROVIDER_SITE_OTHER): Payer: BC Managed Care – PPO | Admitting: Family

## 2014-06-07 VITALS — BP 136/84 | HR 92 | Wt 291.0 lb

## 2014-06-07 DIAGNOSIS — Z98891 History of uterine scar from previous surgery: Secondary | ICD-10-CM | POA: Insufficient documentation

## 2014-06-07 DIAGNOSIS — O09511 Supervision of elderly primigravida, first trimester: Secondary | ICD-10-CM

## 2014-06-07 DIAGNOSIS — Z113 Encounter for screening for infections with a predominantly sexual mode of transmission: Secondary | ICD-10-CM

## 2014-06-07 DIAGNOSIS — Z1151 Encounter for screening for human papillomavirus (HPV): Secondary | ICD-10-CM

## 2014-06-07 DIAGNOSIS — Z3491 Encounter for supervision of normal pregnancy, unspecified, first trimester: Secondary | ICD-10-CM

## 2014-06-07 DIAGNOSIS — Z124 Encounter for screening for malignant neoplasm of cervix: Secondary | ICD-10-CM | POA: Diagnosis not present

## 2014-06-07 DIAGNOSIS — O09519 Supervision of elderly primigravida, unspecified trimester: Secondary | ICD-10-CM | POA: Insufficient documentation

## 2014-06-07 LAB — US OB COMP LESS 14 WKS

## 2014-06-07 NOTE — Progress Notes (Signed)
   Subjective:    Carrie Gibson is a G1P0 [redacted]w[redacted]d being seen today for her first obstetrical visit.  Her obstetrical history is significant for history of infertility.  Pregnancy obtained by intrauterine insemination on 04/23/14. Patient also has inhaler.  Uses daily inhaled steroids and rescue albuterol approximately one time a week.  Patient does intend to breast feed. Pregnancy history fully reviewed.  Patient reports fatigue.  Filed Vitals:   06/07/14 0818  Weight: 291 lb (131.997 kg)    HISTORY: OB History  Gravida Para Term Preterm AB SAB TAB Ectopic Multiple Living  1             # Outcome Date GA Lbr Len/2nd Weight Sex Delivery Anes PTL Lv  1 Current              Past Medical History  Diagnosis Date  . Asthma   . Environmental allergies    Past Surgical History  Procedure Laterality Date  . Foot surgery  1995   Family History  Problem Relation Age of Onset  . Fibroids Mother   . Mental illness Father   . Fibroids Sister     Exam   Wt 291 lb (131.997 kg)  LMP 04/11/2014 Uterine Size: size equals dates  Pelvic Exam:    Perineum: No Hemorrhoids, Normal Perineum   Vulva: normal   Vagina:  normal mucosa, normal discharge, no palpable nodules   pH: Not done   Cervix: no bleeding following Pap, no cervical motion tenderness and no lesions   Adnexa: normal adnexa and no mass, fullness, tenderness   Bony Pelvis: Adequate  System: Breast:  No nipple retraction or dimpling, No nipple discharge or bleeding, No axillary or supraclavicular adenopathy, Normal to palpation without dominant masses   Skin: normal coloration and turgor, no rashes    Neurologic: negative   Extremities: normal strength, tone, and muscle mass   HEENT neck supple with midline trachea and thyroid without masses   Mouth/Teeth mucous membranes moist, pharynx normal without lesions   Neck supple and no masses   Cardiovascular: regular rate and rhythm, no murmurs or gallops   Respiratory:  appears  well, vitals normal, no respiratory distress, acyanotic, normal RR, neck free of mass or lymphadenopathy, chest clear, no wheezing, crepitations, rhonchi, normal symmetric air entry   Abdomen: soft, non-tender; bowel sounds normal; no masses,  no organomegaly   Urinary: urethral meatus normal      Assessment:    Pregnancy:   39 yo G1P0 at [redacted]w[redacted]d wks IUP by  Advanced Maternal Age  Patient Active Problem List   Diagnosis Date Noted  . Supervision of normal pregnancy 06/07/2014  . Asthma 04/13/2012  . Infertility, female 01/17/2012  . History of miscarriage 01/10/2012  . Obesity 12/30/2011        Plan:     Initial labs drawn. Prenatal vitamins. Problem list reviewed and updated. Genetic Screening discussed, referred for genetic counseling due to advanced maternal age.    Follow up in 4 weeks.  Carrie Gibson 06/07/2014

## 2014-06-07 NOTE — Progress Notes (Signed)
IUI procedure 04/23/14  Bedside US shows Single IUP with CRL [redacted]w[redacted]d FHR 173 bpm

## 2014-06-08 LAB — GC/CHLAMYDIA PROBE AMP
CT PROBE, AMP APTIMA: NEGATIVE
GC Probe RNA: NEGATIVE

## 2014-06-08 LAB — HIV ANTIBODY (ROUTINE TESTING W REFLEX): HIV 1&2 Ab, 4th Generation: NONREACTIVE

## 2014-06-09 LAB — CULTURE, OB URINE
Colony Count: NO GROWTH
ORGANISM ID, BACTERIA: NO GROWTH

## 2014-06-10 LAB — OBSTETRIC PANEL
Antibody Screen: NEGATIVE
Basophils Absolute: 0 10*3/uL (ref 0.0–0.1)
Basophils Relative: 0 % (ref 0–1)
EOS ABS: 0.3 10*3/uL (ref 0.0–0.7)
EOS PCT: 3 % (ref 0–5)
HEMATOCRIT: 39.3 % (ref 36.0–46.0)
HEMOGLOBIN: 13.5 g/dL (ref 12.0–15.0)
HEP B S AG: NEGATIVE
LYMPHS ABS: 1.4 10*3/uL (ref 0.7–4.0)
Lymphocytes Relative: 15 % (ref 12–46)
MCH: 29 pg (ref 26.0–34.0)
MCHC: 34.4 g/dL (ref 30.0–36.0)
MCV: 84.3 fL (ref 78.0–100.0)
MONOS PCT: 7 % (ref 3–12)
MPV: 9.3 fL (ref 8.6–12.4)
Monocytes Absolute: 0.7 10*3/uL (ref 0.1–1.0)
Neutro Abs: 7.1 10*3/uL (ref 1.7–7.7)
Neutrophils Relative %: 75 % (ref 43–77)
Platelets: 315 10*3/uL (ref 150–400)
RBC: 4.66 MIL/uL (ref 3.87–5.11)
RDW: 13.3 % (ref 11.5–15.5)
RH TYPE: POSITIVE
RUBELLA: 1.36 {index} — AB (ref <0.90)
WBC: 9.5 10*3/uL (ref 4.0–10.5)

## 2014-06-10 LAB — CYTOLOGY - PAP

## 2014-06-17 ENCOUNTER — Ambulatory Visit (INDEPENDENT_AMBULATORY_CARE_PROVIDER_SITE_OTHER): Payer: BC Managed Care – PPO | Admitting: Family Medicine

## 2014-06-17 ENCOUNTER — Encounter: Payer: Self-pay | Admitting: Family Medicine

## 2014-06-17 VITALS — BP 138/86 | HR 98 | Wt 289.0 lb

## 2014-06-17 DIAGNOSIS — A499 Bacterial infection, unspecified: Secondary | ICD-10-CM | POA: Diagnosis not present

## 2014-06-17 DIAGNOSIS — H1089 Other conjunctivitis: Secondary | ICD-10-CM

## 2014-06-17 DIAGNOSIS — H109 Unspecified conjunctivitis: Secondary | ICD-10-CM

## 2014-06-17 DIAGNOSIS — J329 Chronic sinusitis, unspecified: Secondary | ICD-10-CM

## 2014-06-17 DIAGNOSIS — B9689 Other specified bacterial agents as the cause of diseases classified elsewhere: Secondary | ICD-10-CM

## 2014-06-17 MED ORDER — ERYTHROMYCIN 5 MG/GM OP OINT: TOPICAL_OINTMENT | OPHTHALMIC | Status: DC

## 2014-06-17 NOTE — Progress Notes (Signed)
CC: Carrie Gibson is a 39 y.o. female is here for Conjunctivitis   Subjective: HPI:  Complains of 2 days of reddening of the right eye. It came on abruptly 2 days ago and has been persistent ever since. She tells me that it feels like her lower eyelid is heavy but she has no other ocular pain. Eyelashes are crusty with a dry pus like material every morning. She feels like there is a film covering her eye that causes her to blink frequently to get her vision back. Vision loss is described as only mild when present. She denies photophobia, fevers, chills, headache, nasal congestion or any cold symptoms.   Review Of Systems Outlined In HPI  Past Medical History  Diagnosis Date  . Asthma   . Environmental allergies     Past Surgical History  Procedure Laterality Date  . Foot surgery  1995   Family History  Problem Relation Age of Onset  . Fibroids Mother   . Mental illness Father   . Fibroids Sister     History   Social History  . Marital Status: Married    Spouse Name: N/A  . Number of Children: N/A  . Years of Education: N/A   Occupational History  . Not on file.   Social History Main Topics  . Smoking status: Former Research scientist (life sciences)  . Smokeless tobacco: Not on file  . Alcohol Use: No  . Drug Use: No  . Sexual Activity:    Partners: Male   Other Topics Concern  . Not on file   Social History Narrative     Objective: BP 138/86 mmHg  Pulse 98  Wt 289 lb (131.09 kg)  LMP 04/11/2014  General: Alert and Oriented, No Acute Distress HEENT: Pupils equal, round, reactive to light. Left Conjunctivae clear.  Right conjunctiva Ehas peripheral erythema that improves centrally, anterior chamber is open without debris external ears unremarkable, canals clear with intact TMs with appropriate landmarks.  Middle ear appears open without effusion. Pink inferior turbinates.  Moist mucous membranes, pharynx without inflammation nor lesions.  Neck supple without palpable lymphadenopathy nor  abnormal masses. Extremities: No peripheral edema.  Strong peripheral pulses.  Mental Status: No depression, anxiety, nor agitation. Skin: Warm and dry.  Assessment & Plan: Yuleimy was seen today for conjunctivitis.  Diagnoses and all orders for this visit:  Bacterial sinusitis Orders: -     erythromycin ophthalmic ointment; Apply 1cm ribbon to the underside of lower eyelid 4-5 times a day for seven days.  Bacterial conjunctivitis of right eye    bacterial conjunctivae is: Start erythromycin, discussed contagiousness of her condition. Off of work until Wednesday.Signs and symptoms requring emergent/urgent reevaluation were discussed with the patient.  Return if symptoms worsen or fail to improve.

## 2014-06-19 ENCOUNTER — Encounter: Payer: Self-pay | Admitting: Family Medicine

## 2014-07-08 ENCOUNTER — Ambulatory Visit (INDEPENDENT_AMBULATORY_CARE_PROVIDER_SITE_OTHER): Payer: BC Managed Care – PPO | Admitting: Obstetrics & Gynecology

## 2014-07-08 VITALS — BP 131/84 | HR 86 | Wt 289.0 lb

## 2014-07-08 DIAGNOSIS — E669 Obesity, unspecified: Secondary | ICD-10-CM

## 2014-07-08 DIAGNOSIS — Z3491 Encounter for supervision of normal pregnancy, unspecified, first trimester: Secondary | ICD-10-CM

## 2014-07-09 ENCOUNTER — Encounter: Payer: Self-pay | Admitting: Obstetrics & Gynecology

## 2014-08-05 ENCOUNTER — Ambulatory Visit (INDEPENDENT_AMBULATORY_CARE_PROVIDER_SITE_OTHER): Payer: BC Managed Care – PPO | Admitting: Obstetrics & Gynecology

## 2014-08-05 VITALS — BP 137/87 | HR 86 | Wt 286.0 lb

## 2014-08-05 DIAGNOSIS — Z3492 Encounter for supervision of normal pregnancy, unspecified, second trimester: Secondary | ICD-10-CM

## 2014-08-05 DIAGNOSIS — O09512 Supervision of elderly primigravida, second trimester: Secondary | ICD-10-CM | POA: Diagnosis not present

## 2014-08-05 NOTE — Progress Notes (Signed)
Declines genetic testing.  [place] needed to hear FH.  Early GCT today.  Anatomy US ordered.

## 2014-08-06 ENCOUNTER — Telehealth: Payer: Self-pay | Admitting: *Deleted

## 2014-08-06 ENCOUNTER — Telehealth: Payer: Self-pay | Admitting: Obstetrics & Gynecology

## 2014-08-06 LAB — GLUCOSE TOLERANCE, 1 HOUR (50G) W/O FASTING: Glucose, 1 Hour GTT: 95 mg/dL (ref 70–140)

## 2014-08-06 NOTE — Telephone Encounter (Signed)
LM on voicemail of normal 1 hour GTT and info about the Zikia virus given

## 2014-08-06 NOTE — Telephone Encounter (Signed)
Left message for patient to call regarding her [place] appt that has been scheduled

## 2014-08-21 ENCOUNTER — Ambulatory Visit (HOSPITAL_COMMUNITY)
Admission: RE | Admit: 2014-08-21 | Discharge: 2014-08-21 | Disposition: A | Discharge status: Home / Self Care | Payer: BC Managed Care – PPO | Source: Ambulatory Visit | Attending: Obstetrics & Gynecology | Admitting: Obstetrics & Gynecology

## 2014-08-21 DIAGNOSIS — Z3A18 18 weeks gestation of pregnancy: Secondary | ICD-10-CM | POA: Diagnosis not present

## 2014-08-21 DIAGNOSIS — O99212 Obesity complicating pregnancy, second trimester: Secondary | ICD-10-CM | POA: Diagnosis not present

## 2014-08-21 DIAGNOSIS — O09512 Supervision of elderly primigravida, second trimester: Secondary | ICD-10-CM | POA: Diagnosis not present

## 2014-09-02 ENCOUNTER — Encounter: Payer: BC Managed Care – PPO | Admitting: Obstetrics & Gynecology

## 2014-09-02 ENCOUNTER — Ambulatory Visit (INDEPENDENT_AMBULATORY_CARE_PROVIDER_SITE_OTHER): Payer: BC Managed Care – PPO | Admitting: Advanced Practice Midwife

## 2014-09-02 ENCOUNTER — Encounter: Payer: Self-pay | Admitting: Advanced Practice Midwife

## 2014-09-02 VITALS — BP 122/82 | Wt 278.0 lb

## 2014-09-02 DIAGNOSIS — O09512 Supervision of elderly primigravida, second trimester: Secondary | ICD-10-CM

## 2014-09-02 NOTE — Patient Instructions (Signed)
Second Trimester of Pregnancy The second trimester is from week 13 through week 28, months 4 through 6. The second trimester is often a time when you feel your best. Your body has also adjusted to being pregnant, and you begin to feel better physically. Usually, morning sickness has lessened or quit completely, you may have more energy, and you may have an increase in appetite. The second trimester is also a time when the fetus is growing rapidly. At the end of the sixth month, the fetus is about 9 inches long and weighs about 1 pounds. You will likely begin to feel the baby move (quickening) between 18 and 20 weeks of the pregnancy. BODY CHANGES Your body goes through many changes during pregnancy. The changes vary from woman to woman.   Your weight will continue to increase. You will notice your lower abdomen bulging out.  You may begin to get stretch marks on your hips, abdomen, and breasts.  You may develop headaches that can be relieved by medicines approved by your health care provider.  You may urinate more often because the fetus is pressing on your bladder.  You may develop or continue to have heartburn as a result of your pregnancy.  You may develop constipation because certain hormones are causing the muscles that push waste through your intestines to slow down.  You may develop hemorrhoids or swollen, bulging veins (varicose veins).  You may have back pain because of the weight gain and pregnancy hormones relaxing your joints between the bones in your pelvis and as a result of a shift in weight and the muscles that support your balance.  Your breasts will continue to grow and be tender.  Your gums may bleed and may be sensitive to brushing and flossing.  Dark spots or blotches (chloasma, mask of pregnancy) may develop on your face. This will likely fade after the baby is born.  A dark line from your belly button to the pubic area (linea nigra) may appear. This will likely fade  after the baby is born.  You may have changes in your hair. These can include thickening of your hair, rapid growth, and changes in texture. Some women also have hair loss during or after pregnancy, or hair that feels dry or thin. Your hair will most likely return to normal after your baby is born. WHAT TO EXPECT AT YOUR PRENATAL VISITS During a routine prenatal visit:  You will be weighed to make sure you and the fetus are growing normally.  Your blood pressure will be taken.  Your abdomen will be measured to track your baby's growth.  The fetal heartbeat will be listened to.  Any test results from the previous visit will be discussed. Your health care provider may ask you:  How you are feeling.  If you are feeling the baby move.  If you have had any abnormal symptoms, such as leaking fluid, bleeding, severe headaches, or abdominal cramping.  If you have any questions. Other tests that may be performed during your second trimester include:  Blood tests that check for:  Low iron levels (anemia).  Gestational diabetes (between 24 and 28 weeks).  Rh antibodies.  Urine tests to check for infections, diabetes, or protein in the urine.  An ultrasound to confirm the proper growth and development of the baby.  An amniocentesis to check for possible genetic problems.  Fetal screens for spina bifida and Down syndrome. HOME CARE INSTRUCTIONS   Avoid all smoking, herbs, alcohol, and unprescribed   drugs. These chemicals affect the formation and growth of the baby.  Follow your health care provider's instructions regarding medicine use. There are medicines that are either safe or unsafe to take during pregnancy.  Exercise only as directed by your health care provider. Experiencing uterine cramps is a good sign to stop exercising.  Continue to eat regular, healthy meals.  Wear a good support bra for breast tenderness.  Do not use hot tubs, steam rooms, or saunas.  Wear your  seat belt at all times when driving.  Avoid raw meat, uncooked cheese, cat litter boxes, and soil used by cats. These carry germs that can cause birth defects in the baby.  Take your prenatal vitamins.  Try taking a stool softener (if your health care provider approves) if you develop constipation. Eat more high-fiber foods, such as fresh vegetables or fruit and whole grains. Drink plenty of fluids to keep your urine clear or pale yellow.  Take warm sitz baths to soothe any pain or discomfort caused by hemorrhoids. Use hemorrhoid cream if your health care provider approves.  If you develop varicose veins, wear support hose. Elevate your feet for 15 minutes, 3-4 times a day. Limit salt in your diet.  Avoid heavy lifting, wear low heel shoes, and practice good posture.  Rest with your legs elevated if you have leg cramps or low back pain.  Visit your dentist if you have not gone yet during your pregnancy. Use a soft toothbrush to brush your teeth and be gentle when you floss.  A sexual relationship may be continued unless your health care provider directs you otherwise.  Continue to go to all your prenatal visits as directed by your health care provider. SEEK MEDICAL CARE IF:   You have dizziness.  You have mild pelvic cramps, pelvic pressure, or nagging pain in the abdominal area.  You have persistent nausea, vomiting, or diarrhea.  You have a bad smelling vaginal discharge.  You have pain with urination. SEEK IMMEDIATE MEDICAL CARE IF:   You have a fever.  You are leaking fluid from your vagina.  You have spotting or bleeding from your vagina.  You have severe abdominal cramping or pain.  You have rapid weight gain or loss.  You have shortness of breath with chest pain.  You notice sudden or extreme swelling of your face, hands, ankles, feet, or legs.  You have not felt your baby move in over an hour.  You have severe headaches that do not go away with  medicine.  You have vision changes. Document Released: 03/23/2001 Document Revised: 04/03/2013 Document Reviewed: 05/30/2012 Exit[employer] Patient Information 2015 [employer]. This information is not intended to replace advice given to you by your health care provider. Make sure you discuss any questions you have with your health care provider.  

## 2014-09-02 NOTE — Progress Notes (Signed)
Subjective:   Carrie Gibson is a 39 y.o. G1P0 [redacted]w[redacted]d being seen today for her obstetrical visit.  Patient reports no complaints and episoded of decreased awareness of fetal movement.  Denies contractions, vaginal bleeding or leaking of fluid.  Reports good fetal movement.  The following portions of the patient's history were reviewed and updated as appropriate: allergies, current medications, past family history, past medical history, past social history, past surgical history and problem list.   Objective:  BP 122/82 mmHg  Wt 278 lb (126.1 kg)  LMP 04/11/2014  FHT:  150s per US  Uterine Size:  20  Fetal Movement: Movement: Absent  Presentation:  undeterminable    Abdomen:  soft, gravid, appropriate for gestational age,non-tender  Vaginal:  Discharge, white   normal      No results found for this or any previous visit (from the past 24 hour(s)).  Assessment and Plan:   Pregnancy:  G1P0 at [redacted]w[redacted]d  1. Advanced maternal age, 1st pregnancy, second trimester      Normal FHR per [place] - US OB Follow Up; Future Normal anatomy with limited views of heart    Preterm labor symptoms: vaginal bleeding, contractions and leaking of fluid reviewed in detail.  Fetal movement precautions reviewed.  Follow up in 3 weeks  Will schedule followup US for anatomy.  Seabron Spates, CNM

## 2014-09-05 ENCOUNTER — Encounter: Payer: Self-pay | Admitting: *Deleted

## 2014-09-05 ENCOUNTER — Encounter: Payer: Self-pay | Admitting: Advanced Practice Midwife

## 2014-09-21 ENCOUNTER — Other Ambulatory Visit: Payer: Self-pay | Admitting: Family Medicine

## 2014-09-23 ENCOUNTER — Ambulatory Visit (INDEPENDENT_AMBULATORY_CARE_PROVIDER_SITE_OTHER): Payer: BC Managed Care – PPO | Admitting: Advanced Practice Midwife

## 2014-09-23 VITALS — BP 132/81 | HR 85 | Wt 282.0 lb

## 2014-09-23 DIAGNOSIS — IMO0002 Reserved for concepts with insufficient information to code with codable children: Secondary | ICD-10-CM

## 2014-09-23 DIAGNOSIS — Z3492 Encounter for supervision of normal pregnancy, unspecified, second trimester: Secondary | ICD-10-CM

## 2014-09-23 DIAGNOSIS — Z36 Encounter for antenatal screening of mother: Secondary | ICD-10-CM

## 2014-09-23 DIAGNOSIS — Z0489 Encounter for examination and observation for other specified reasons: Secondary | ICD-10-CM

## 2014-09-23 NOTE — Progress Notes (Signed)
F/U US scheduled 6/15. GTT at [facility].

## 2014-09-23 NOTE — Patient Instructions (Signed)
Second Trimester of Pregnancy The second trimester is from week 13 through week 28, months 4 through 6. The second trimester is often a time when you feel your best. Your body has also adjusted to being pregnant, and you begin to feel better physically. Usually, morning sickness has lessened or quit completely, you may have more energy, and you may have an increase in appetite. The second trimester is also a time when the fetus is growing rapidly. At the end of the sixth month, the fetus is about 9 inches long and weighs about 1 pounds. You will likely begin to feel the baby move (quickening) between 18 and 20 weeks of the pregnancy. BODY CHANGES Your body goes through many changes during pregnancy. The changes vary from woman to woman.   Your weight will continue to increase. You will notice your lower abdomen bulging out.  You may begin to get stretch marks on your hips, abdomen, and breasts.  You may develop headaches that can be relieved by medicines approved by your health care provider.  You may urinate more often because the fetus is pressing on your bladder.  You may develop or continue to have heartburn as a result of your pregnancy.  You may develop constipation because certain hormones are causing the muscles that push waste through your intestines to slow down.  You may develop hemorrhoids or swollen, bulging veins (varicose veins).  You may have back pain because of the weight gain and pregnancy hormones relaxing your joints between the bones in your pelvis and as a result of a shift in weight and the muscles that support your balance.  Your breasts will continue to grow and be tender.  Your gums may bleed and may be sensitive to brushing and flossing.  Dark spots or blotches (chloasma, mask of pregnancy) may develop on your face. This will likely fade after the baby is born.  A dark line from your belly button to the pubic area (linea nigra) may appear. This will likely fade  after the baby is born.  You may have changes in your hair. These can include thickening of your hair, rapid growth, and changes in texture. Some women also have hair loss during or after pregnancy, or hair that feels dry or thin. Your hair will most likely return to normal after your baby is born. WHAT TO EXPECT AT YOUR PRENATAL VISITS During a routine prenatal visit:  You will be weighed to make sure you and the fetus are growing normally.  Your blood pressure will be taken.  Your abdomen will be measured to track your baby's growth.  The fetal heartbeat will be listened to.  Any test results from the previous visit will be discussed. Your health care provider may ask you:  How you are feeling.  If you are feeling the baby move.  If you have had any abnormal symptoms, such as leaking fluid, bleeding, severe headaches, or abdominal cramping.  If you have any questions. Other tests that may be performed during your second trimester include:  Blood tests that check for:  Low iron levels (anemia).  Gestational diabetes (between 24 and 28 weeks).  Rh antibodies.  Urine tests to check for infections, diabetes, or protein in the urine.  An ultrasound to confirm the proper growth and development of the baby.  An amniocentesis to check for possible genetic problems.  Fetal screens for spina bifida and Down syndrome. HOME CARE INSTRUCTIONS   Avoid all smoking, herbs, alcohol, and unprescribed   drugs. These chemicals affect the formation and growth of the baby.  Follow your health care provider's instructions regarding medicine use. There are medicines that are either safe or unsafe to take during pregnancy.  Exercise only as directed by your health care provider. Experiencing uterine cramps is a good sign to stop exercising.  Continue to eat regular, healthy meals.  Wear a good support bra for breast tenderness.  Do not use hot tubs, steam rooms, or saunas.  Wear your  seat belt at all times when driving.  Avoid raw meat, uncooked cheese, cat litter boxes, and soil used by cats. These carry germs that can cause birth defects in the baby.  Take your prenatal vitamins.  Try taking a stool softener (if your health care provider approves) if you develop constipation. Eat more high-fiber foods, such as fresh vegetables or fruit and whole grains. Drink plenty of fluids to keep your urine clear or pale yellow.  Take warm sitz baths to soothe any pain or discomfort caused by hemorrhoids. Use hemorrhoid cream if your health care provider approves.  If you develop varicose veins, wear support hose. Elevate your feet for 15 minutes, 3-4 times a day. Limit salt in your diet.  Avoid heavy lifting, wear low heel shoes, and practice good posture.  Rest with your legs elevated if you have leg cramps or low back pain.  Visit your dentist if you have not gone yet during your pregnancy. Use a soft toothbrush to brush your teeth and be gentle when you floss.  A sexual relationship may be continued unless your health care provider directs you otherwise.  Continue to go to all your prenatal visits as directed by your health care provider. SEEK MEDICAL CARE IF:   You have dizziness.  You have mild pelvic cramps, pelvic pressure, or nagging pain in the abdominal area.  You have persistent nausea, vomiting, or diarrhea.  You have a bad smelling vaginal discharge.  You have pain with urination. SEEK IMMEDIATE MEDICAL CARE IF:   You have a fever.  You are leaking fluid from your vagina.  You have spotting or bleeding from your vagina.  You have severe abdominal cramping or pain.  You have rapid weight gain or loss.  You have shortness of breath with chest pain.  You notice sudden or extreme swelling of your face, hands, ankles, feet, or legs.  You have not felt your baby move in over an hour.  You have severe headaches that do not go away with  medicine.  You have vision changes. Document Released: 03/23/2001 Document Revised: 04/03/2013 Document Reviewed: 05/30/2012 Exit[employer] Patient Information 2015 [employer]. This information is not intended to replace advice given to you by your health care provider. Make sure you discuss any questions you have with your health care provider.  

## 2014-09-25 ENCOUNTER — Ambulatory Visit (HOSPITAL_COMMUNITY)
Admission: RE | Admit: 2014-09-25 | Discharge: 2014-09-25 | Disposition: A | Discharge status: Home / Self Care | Payer: BC Managed Care – PPO | Source: Ambulatory Visit | Attending: Advanced Practice Midwife | Admitting: Advanced Practice Midwife

## 2014-09-25 ENCOUNTER — Ambulatory Visit (HOSPITAL_COMMUNITY): Payer: BC Managed Care – PPO

## 2014-09-25 DIAGNOSIS — Z36 Encounter for antenatal screening of mother: Secondary | ICD-10-CM | POA: Diagnosis not present

## 2014-09-25 DIAGNOSIS — Z3492 Encounter for supervision of normal pregnancy, unspecified, second trimester: Secondary | ICD-10-CM

## 2014-09-25 DIAGNOSIS — Z3A23 23 weeks gestation of pregnancy: Secondary | ICD-10-CM | POA: Diagnosis not present

## 2014-09-25 DIAGNOSIS — O99212 Obesity complicating pregnancy, second trimester: Secondary | ICD-10-CM | POA: Insufficient documentation

## 2014-09-25 DIAGNOSIS — O99210 Obesity complicating pregnancy, unspecified trimester: Secondary | ICD-10-CM | POA: Insufficient documentation

## 2014-09-25 DIAGNOSIS — O09512 Supervision of elderly primigravida, second trimester: Secondary | ICD-10-CM

## 2014-09-25 DIAGNOSIS — Z0489 Encounter for examination and observation for other specified reasons: Secondary | ICD-10-CM | POA: Insufficient documentation

## 2014-09-25 DIAGNOSIS — IMO0002 Reserved for concepts with insufficient information to code with codable children: Secondary | ICD-10-CM | POA: Insufficient documentation

## 2014-10-28 ENCOUNTER — Ambulatory Visit (INDEPENDENT_AMBULATORY_CARE_PROVIDER_SITE_OTHER): Payer: BC Managed Care – PPO | Admitting: Advanced Practice Midwife

## 2014-10-28 ENCOUNTER — Encounter: Payer: Self-pay | Admitting: Advanced Practice Midwife

## 2014-10-28 VITALS — BP 122/71 | HR 78 | Wt 290.0 lb

## 2014-10-28 DIAGNOSIS — Z23 Encounter for immunization: Secondary | ICD-10-CM | POA: Diagnosis not present

## 2014-10-28 DIAGNOSIS — Z3492 Encounter for supervision of normal pregnancy, unspecified, second trimester: Secondary | ICD-10-CM

## 2014-10-28 DIAGNOSIS — Z3493 Encounter for supervision of normal pregnancy, unspecified, third trimester: Secondary | ICD-10-CM

## 2014-10-28 MED ORDER — TETANUS-DIPHTH-ACELL PERTUSSIS 5-2.5-18.5 LF-MCG/0.5 IM SUSP
0.5000 mL | Freq: Once | INTRAMUSCULAR | Status: AC
  Administered 2014-10-28: 0.5 mL via INTRAMUSCULAR

## 2014-10-28 NOTE — Progress Notes (Signed)
Subjective:  Rabiah Pillard is a 39 y.o. G1P0 at [redacted]w[redacted]d being seen today for ongoing prenatal care.  Patient reports backache.  Contractions: Not present.  Vag. Bleeding: Scant. Movement: Present. Denies leaking of fluid.   The following portions of the patient's history were reviewed and updated as appropriate: allergies, current medications, past family history, past medical history, past social history, past surgical history and problem list.   Objective:   Filed Vitals:   10/28/14 0911  BP: 122/71  Pulse: 78  Weight: 290 lb (131.543 kg)    Fetal Status: Fetal Heart Rate (bpm): 136   Movement: Present     General:  Alert, oriented and cooperative. Patient is in no acute distress.  Skin: Skin is warm and dry. No rash noted.   Cardiovascular: Normal heart rate noted  Respiratory: Normal respiratory effort, no problems with respiration noted  Abdomen: Soft, gravid, appropriate for gestational age. Pain/Pressure: Present     Vaginal: Vag. Bleeding: Scant.    Vag D/C Character: Thin  Cervix: Not evaluated        Extremities: Normal range of motion.  Edema: None  Mental Status: Normal mood and affect. Normal behavior. Normal judgment and thought content.   Urinalysis: Urine Protein: Negative Urine Glucose: Negative  Last US showed 87%ile growth and continued inability to complete heart anatomy They recommend growth [place] at 30 wks  Assessment and Plan:  Pregnancy: G1P0 at [redacted]w[redacted]d  1. Normal pregnancy, third trimester     Ordered followup US for August 1st  - Glucose Tolerance, 1 HR (50g) - RPR - CBC - HIV antibody (with reflex) - Tdap (BOOSTRIX) injection 0.5 mL; Inject 0.5 mLs into the muscle once. - US OB Follow Up; Future  2. Supervision of normal pregnancy, second trimester        Preterm labor symptoms and general obstetric precautions including but not limited to vaginal bleeding, contractions, leaking of fluid and fetal movement were reviewed in detail with the  patient. Please refer to After Visit Summary for other counseling recommendations.  Return in about 2 weeks (around 11/11/2014) for [employer].   Seabron Spates, CNM

## 2014-10-28 NOTE — Patient Instructions (Signed)
Third Trimester of Pregnancy The third trimester is from week 29 through week 42, months 7 through 9. The third trimester is a time when the fetus is growing rapidly. At the end of the ninth month, the fetus is about 20 inches in length and weighs 6-10 pounds.  BODY CHANGES Your body goes through many changes during pregnancy. The changes vary from woman to woman.   Your weight will continue to increase. You can expect to gain 25-35 pounds (11-16 kg) by the end of the pregnancy.  You may begin to get stretch marks on your hips, abdomen, and breasts.  You may urinate more often because the fetus is moving lower into your pelvis and pressing on your bladder.  You may develop or continue to have heartburn as a result of your pregnancy.  You may develop constipation because certain hormones are causing the muscles that push waste through your intestines to slow down.  You may develop hemorrhoids or swollen, bulging veins (varicose veins).  You may have pelvic pain because of the weight gain and pregnancy hormones relaxing your joints between the bones in your pelvis. Backaches may result from overexertion of the muscles supporting your posture.  You may have changes in your hair. These can include thickening of your hair, rapid growth, and changes in texture. Some women also have hair loss during or after pregnancy, or hair that feels dry or thin. Your hair will most likely return to normal after your baby is born.  Your breasts will continue to grow and be tender. A yellow discharge may leak from your breasts called colostrum.  Your belly button may stick out.  You may feel short of breath because of your expanding uterus.  You may notice the fetus "dropping," or moving lower in your abdomen.  You may have a bloody mucus discharge. This usually occurs a few days to a week before labor begins.  Your cervix becomes thin and soft (effaced) near your due date. WHAT TO EXPECT AT YOUR PRENATAL  EXAMS  You will have prenatal exams every 2 weeks until week 36. Then, you will have weekly prenatal exams. During a routine prenatal visit:  You will be weighed to make sure you and the fetus are growing normally.  Your blood pressure is taken.  Your abdomen will be measured to track your baby's growth.  The fetal heartbeat will be listened to.  Any test results from the previous visit will be discussed.  You may have a cervical check near your due date to see if you have effaced. At around 36 weeks, your caregiver will check your cervix. At the same time, your caregiver will also perform a test on the secretions of the vaginal tissue. This test is to determine if a type of bacteria, Group B streptococcus, is present. Your caregiver will explain this further. Your caregiver may ask you:  What your birth plan is.  How you are feeling.  If you are feeling the baby move.  If you have had any abnormal symptoms, such as leaking fluid, bleeding, severe headaches, or abdominal cramping.  If you have any questions. Other tests or screenings that may be performed during your third trimester include:  Blood tests that check for low iron levels (anemia).  Fetal testing to check the health, activity level, and growth of the fetus. Testing is done if you have certain medical conditions or if there are problems during the pregnancy. FALSE LABOR You may feel small, irregular contractions that   eventually go away. These are called Braxton Hicks contractions, or false labor. Contractions may last for hours, days, or even weeks before true labor sets in. If contractions come at regular intervals, intensify, or become painful, it is best to be seen by your caregiver.  SIGNS OF LABOR   Menstrual-like cramps.  Contractions that are 5 minutes apart or less.  Contractions that start on the top of the uterus and spread down to the lower abdomen and back.  A sense of increased pelvic pressure or back  pain.  A watery or bloody mucus discharge that comes from the vagina. If you have any of these signs before the 37th week of pregnancy, call your caregiver right away. You need to go to the hospital to get checked immediately. HOME CARE INSTRUCTIONS   Avoid all smoking, herbs, alcohol, and unprescribed drugs. These chemicals affect the formation and growth of the baby.  Follow your caregiver's instructions regarding medicine use. There are medicines that are either safe or unsafe to take during pregnancy.  Exercise only as directed by your caregiver. Experiencing uterine cramps is a good sign to stop exercising.  Continue to eat regular, healthy meals.  Wear a good support bra for breast tenderness.  Do not use hot tubs, steam rooms, or saunas.  Wear your seat belt at all times when driving.  Avoid raw meat, uncooked cheese, cat litter boxes, and soil used by cats. These carry germs that can cause birth defects in the baby.  Take your prenatal vitamins.  Try taking a stool softener (if your caregiver approves) if you develop constipation. Eat more high-fiber foods, such as fresh vegetables or fruit and whole grains. Drink plenty of fluids to keep your urine clear or pale yellow.  Take warm sitz baths to soothe any pain or discomfort caused by hemorrhoids. Use hemorrhoid cream if your caregiver approves.  If you develop varicose veins, wear support hose. Elevate your feet for 15 minutes, 3-4 times a day. Limit salt in your diet.  Avoid heavy lifting, wear low heal shoes, and practice good posture.  Rest a lot with your legs elevated if you have leg cramps or low back pain.  Visit your dentist if you have not gone during your pregnancy. Use a soft toothbrush to brush your teeth and be gentle when you floss.  A sexual relationship may be continued unless your caregiver directs you otherwise.  Do not travel far distances unless it is absolutely necessary and only with the approval  of your caregiver.  Take prenatal classes to understand, practice, and ask questions about the labor and delivery.  Make a trial run to the hospital.  Pack your hospital bag.  Prepare the baby's nursery.  Continue to go to all your prenatal visits as directed by your caregiver. SEEK MEDICAL CARE IF:  You are unsure if you are in labor or if your water has broken.  You have dizziness.  You have mild pelvic cramps, pelvic pressure, or nagging pain in your abdominal area.  You have persistent nausea, vomiting, or diarrhea.  You have a bad smelling vaginal discharge.  You have pain with urination. SEEK IMMEDIATE MEDICAL CARE IF:   You have a fever.  You are leaking fluid from your vagina.  You have spotting or bleeding from your vagina.  You have severe abdominal cramping or pain.  You have rapid weight loss or gain.  You have shortness of breath with chest pain.  You notice sudden or extreme swelling   of your face, hands, ankles, feet, or legs.  You have not felt your baby move in over an hour.  You have severe headaches that do not go away with medicine.  You have vision changes. Document Released: 03/23/2001 Document Revised: 04/03/2013 Document Reviewed: 05/30/2012 Exit[employer] Patient Information 2015 [employer]. This information is not intended to replace advice given to you by your health care provider. Make sure you discuss any questions you have with your health care provider.  

## 2014-10-29 ENCOUNTER — Telehealth: Payer: Self-pay | Admitting: *Deleted

## 2014-10-29 LAB — CBC
HEMATOCRIT: 35.5 % — AB (ref 36.0–46.0)
HEMOGLOBIN: 11.4 g/dL — AB (ref 12.0–15.0)
MCH: 28.1 pg (ref 26.0–34.0)
MCHC: 32.1 g/dL (ref 30.0–36.0)
MCV: 87.4 fL (ref 78.0–100.0)
MPV: 9.3 fL (ref 8.6–12.4)
PLATELETS: 227 10*3/uL (ref 150–400)
RBC: 4.06 MIL/uL (ref 3.87–5.11)
RDW: 13.7 % (ref 11.5–15.5)
WBC: 10.3 10*3/uL (ref 4.0–10.5)

## 2014-10-29 LAB — GLUCOSE TOLERANCE, 1 HOUR (50G) W/O FASTING: GLUCOSE 1 HOUR GTT: 117 mg/dL (ref 70–140)

## 2014-10-29 LAB — RPR

## 2014-10-29 LAB — HIV ANTIBODY (ROUTINE TESTING W REFLEX): HIV: NONREACTIVE

## 2014-10-29 NOTE — Telephone Encounter (Signed)
LM on voicemail of normal 1 hr GTT.

## 2014-11-06 ENCOUNTER — Ambulatory Visit (INDEPENDENT_AMBULATORY_CARE_PROVIDER_SITE_OTHER): Payer: Self-pay

## 2014-11-06 DIAGNOSIS — O09519 Supervision of elderly primigravida, unspecified trimester: Secondary | ICD-10-CM

## 2014-11-06 NOTE — Progress Notes (Signed)
FMLA forms complete. Kathrene Alu RN BSN

## 2014-11-11 ENCOUNTER — Ambulatory Visit (HOSPITAL_COMMUNITY)
Admission: RE | Admit: 2014-11-11 | Discharge: 2014-11-11 | Disposition: A | Discharge status: Home / Self Care | Payer: BC Managed Care – PPO | Source: Ambulatory Visit | Attending: Advanced Practice Midwife | Admitting: Advanced Practice Midwife

## 2014-11-11 DIAGNOSIS — Z3493 Encounter for supervision of normal pregnancy, unspecified, third trimester: Secondary | ICD-10-CM | POA: Diagnosis not present

## 2014-11-11 DIAGNOSIS — O99213 Obesity complicating pregnancy, third trimester: Secondary | ICD-10-CM | POA: Insufficient documentation

## 2014-11-11 DIAGNOSIS — Z0489 Encounter for examination and observation for other specified reasons: Secondary | ICD-10-CM | POA: Insufficient documentation

## 2014-11-11 DIAGNOSIS — IMO0002 Reserved for concepts with insufficient information to code with codable children: Secondary | ICD-10-CM

## 2014-11-11 DIAGNOSIS — Z3A30 30 weeks gestation of pregnancy: Secondary | ICD-10-CM | POA: Insufficient documentation

## 2014-11-12 ENCOUNTER — Ambulatory Visit (INDEPENDENT_AMBULATORY_CARE_PROVIDER_SITE_OTHER): Payer: BC Managed Care – PPO | Admitting: Obstetrics & Gynecology

## 2014-11-12 VITALS — BP 130/73 | HR 93 | Wt 291.0 lb

## 2014-11-12 DIAGNOSIS — Z3403 Encounter for supervision of normal first pregnancy, third trimester: Secondary | ICD-10-CM

## 2014-11-12 DIAGNOSIS — Z3493 Encounter for supervision of normal pregnancy, unspecified, third trimester: Secondary | ICD-10-CM

## 2014-11-12 DIAGNOSIS — O99213 Obesity complicating pregnancy, third trimester: Secondary | ICD-10-CM

## 2014-11-12 DIAGNOSIS — E669 Obesity, unspecified: Secondary | ICD-10-CM

## 2014-11-12 NOTE — Progress Notes (Signed)
[facility]

## 2014-11-12 NOTE — Progress Notes (Signed)
Subjective:  Carrie Gibson is a 39 y.o. G1P0 at [redacted]w[redacted]d being seen today for ongoing prenatal care.  Patient reports no complaints.  Contractions: Not present.  Vag. Bleeding: None. Movement: Present. Denies leaking of fluid.   The following portions of the patient's history were reviewed and updated as appropriate: allergies, current medications, past family history, past medical history, past social history, past surgical history and problem list.   Objective:   Filed Vitals:   11/12/14 1324  BP: 130/73  Pulse: 93  Weight: 291 lb (131.997 kg)    Fetal Status: Fetal Heart Rate (bpm): 141   Movement: Present     General:  Alert, oriented and cooperative. Patient is in no acute distress.  Skin: Skin is warm and dry. No rash noted.   Cardiovascular: Normal heart rate noted  Respiratory: Normal respiratory effort, no problems with respiration noted  Abdomen: Soft, gravid, appropriate for gestational age. Pain/Pressure: Absent     Vaginal: Vag. Bleeding: None.    Vag D/C Character: Thin  Cervix: Not evaluated        Extremities: Normal range of motion.  Edema: None  Mental Status: Normal mood and affect. Normal behavior. Normal judgment and thought content.   Urinalysis: Urine Protein: Negative Urine Glucose: Negative  Assessment and Plan:  Pregnancy: G1P0 at [redacted]w[redacted]d  1. Supervision of normal pregnancy, third trimester   2. Obesity affecting pregnancy in third trimester She will get another u/s for growth at 35 weeks  Preterm labor symptoms and general obstetric precautions including but not limited to vaginal bleeding, contractions, leaking of fluid and fetal movement were reviewed in detail with the patient. Please refer to After Visit Summary for other counseling recommendations.  No Follow-up on file.   Emily Filbert, MD

## 2014-11-13 DIAGNOSIS — IMO0002 Reserved for concepts with insufficient information to code with codable children: Secondary | ICD-10-CM | POA: Insufficient documentation

## 2014-11-27 ENCOUNTER — Encounter: Payer: Self-pay | Admitting: Obstetrics & Gynecology

## 2014-11-27 ENCOUNTER — Ambulatory Visit (INDEPENDENT_AMBULATORY_CARE_PROVIDER_SITE_OTHER): Payer: BC Managed Care – PPO | Admitting: Obstetrics & Gynecology

## 2014-11-27 VITALS — BP 132/92 | HR 85 | Wt 295.0 lb

## 2014-11-27 DIAGNOSIS — E669 Obesity, unspecified: Secondary | ICD-10-CM

## 2014-11-27 DIAGNOSIS — Z3403 Encounter for supervision of normal first pregnancy, third trimester: Secondary | ICD-10-CM

## 2014-11-27 DIAGNOSIS — O99213 Obesity complicating pregnancy, third trimester: Secondary | ICD-10-CM

## 2014-11-27 DIAGNOSIS — Z3493 Encounter for supervision of normal pregnancy, unspecified, third trimester: Secondary | ICD-10-CM

## 2014-11-27 NOTE — Progress Notes (Signed)
Subjective:  Carrie Gibson is a 39 y.o. G1P0 at [redacted]w[redacted]d being seen today for ongoing prenatal care.  Patient reports no complaints.  Contractions: Not present.  Vag. Bleeding: None. Movement: Present. Denies leaking of fluid.   The following portions of the patient's history were reviewed and updated as appropriate: allergies, current medications, past family history, past medical history, past social history, past surgical history and problem list.   Objective:   Filed Vitals:   11/27/14 1357  BP: 132/92  Pulse: 85  Weight: 295 lb (133.811 kg)    Fetal Status: Fetal Heart Rate (bpm): 142 Fundal Height: 40 cm Movement: Present     General:  Alert, oriented and cooperative. Patient is in no acute distress.  Skin: Skin is warm and dry. No rash noted.   Cardiovascular: Normal heart rate noted  Respiratory: Normal respiratory effort, no problems with respiration noted  Abdomen: Soft, gravid, appropriate for gestational age. Pain/Pressure: Absent     Pelvic: Vag. Bleeding: None Vag D/C Character: Thin   Cervical exam deferred        Extremities: Normal range of motion.  Edema: None  Mental Status: Normal mood and affect. Normal behavior. Normal judgment and thought content.   Urinalysis: Urine Protein: Negative Urine Glucose: Negative  Assessment and Plan:  Pregnancy: G1P0 at [redacted]w[redacted]d  1. Supervision of normal pregnancy, third trimester   2. Obesity affecting pregnancy in third trimester -Her next u/s is sched'd for 12-12-14  Preterm labor symptoms and general obstetric precautions including but not limited to vaginal bleeding, contractions, leaking of fluid and fetal movement were reviewed in detail with the patient. Please refer to After Visit Summary for other counseling recommendations.  Return in about 2 weeks (around 12/11/2014).   Emily Filbert, MD

## 2014-12-10 ENCOUNTER — Ambulatory Visit (INDEPENDENT_AMBULATORY_CARE_PROVIDER_SITE_OTHER): Payer: BC Managed Care – PPO | Admitting: Obstetrics & Gynecology

## 2014-12-10 VITALS — BP 110/84 | Wt 297.0 lb

## 2014-12-10 DIAGNOSIS — Z23 Encounter for immunization: Secondary | ICD-10-CM | POA: Diagnosis not present

## 2014-12-10 DIAGNOSIS — Z3493 Encounter for supervision of normal pregnancy, unspecified, third trimester: Secondary | ICD-10-CM

## 2014-12-10 DIAGNOSIS — Z3483 Encounter for supervision of other normal pregnancy, third trimester: Secondary | ICD-10-CM

## 2014-12-10 MED ORDER — INFLUENZA VAC SPLIT QUAD 0.5 ML IM SUSY
0.5000 mL | PREFILLED_SYRINGE | Freq: Once | INTRAMUSCULAR | Status: AC
  Administered 2014-12-10: 0.5 mL via INTRAMUSCULAR

## 2014-12-10 NOTE — Progress Notes (Signed)
Subjective:  Carrie Gibson is a 39 y.o. G1P0 at [redacted]w[redacted]d being seen today for ongoing prenatal care.  Patient reports no complaints.  Contractions: Not present.  Vag. Bleeding: None. Movement: Present. Denies leaking of fluid.   The following portions of the patient's history were reviewed and updated as appropriate: allergies, current medications, past family history, past medical history, past social history, past surgical history and problem list.   Objective:   Filed Vitals:   12/10/14 1551  BP: 110/84  Weight: 297 lb (134.718 kg)    Fetal Status: Fetal Heart Rate (bpm): 141 Fundal Height: 41 cm Movement: Present     General:  Alert, oriented and cooperative. Patient is in no acute distress.  Skin: Skin is warm and dry. No rash noted.   Cardiovascular: Normal heart rate noted  Respiratory: Normal respiratory effort, no problems with respiration noted  Abdomen: Soft, gravid, appropriate for gestational age. Pain/Pressure: Absent     Pelvic: Vag. Bleeding: None Vag D/C Character: Thin   Cervical exam deferred        Extremities: Normal range of motion.  Edema: Trace  Mental Status: Normal mood and affect. Normal behavior. Normal judgment and thought content.   Urinalysis: Urine Protein: Negative Urine Glucose: Negative  Assessment and Plan:  Pregnancy: G1P0 at [redacted]w[redacted]d  1. Supervision of normal pregnancy, third trimester - growth u/s this week - Influenza vac split quadrivalent PF (FLUARIX) injection 0.5 mL; Inject 0.5 mLs into the muscle once.  Preterm labor symptoms and general obstetric precautions including but not limited to vaginal bleeding, contractions, leaking of fluid and fetal movement were reviewed in detail with the patient. Please refer to After Visit Summary for other counseling recommendations.  Return in about 1 week (around 12/17/2014).   Emily Filbert, MD

## 2014-12-12 ENCOUNTER — Ambulatory Visit (HOSPITAL_COMMUNITY)
Admission: RE | Admit: 2014-12-12 | Discharge: 2014-12-12 | Disposition: A | Discharge status: Home / Self Care | Payer: BC Managed Care – PPO | Source: Ambulatory Visit | Attending: Obstetrics & Gynecology | Admitting: Obstetrics & Gynecology

## 2014-12-12 DIAGNOSIS — O99213 Obesity complicating pregnancy, third trimester: Secondary | ICD-10-CM

## 2014-12-12 DIAGNOSIS — E669 Obesity, unspecified: Secondary | ICD-10-CM | POA: Diagnosis not present

## 2014-12-12 DIAGNOSIS — IMO0002 Reserved for concepts with insufficient information to code with codable children: Secondary | ICD-10-CM

## 2014-12-19 ENCOUNTER — Ambulatory Visit (INDEPENDENT_AMBULATORY_CARE_PROVIDER_SITE_OTHER): Payer: BC Managed Care – PPO | Admitting: Obstetrics & Gynecology

## 2014-12-19 ENCOUNTER — Other Ambulatory Visit: Payer: Self-pay | Admitting: Obstetrics & Gynecology

## 2014-12-19 VITALS — BP 128/88 | Wt 298.0 lb

## 2014-12-19 DIAGNOSIS — Z3493 Encounter for supervision of normal pregnancy, unspecified, third trimester: Secondary | ICD-10-CM

## 2014-12-19 DIAGNOSIS — Z36 Encounter for antenatal screening of mother: Secondary | ICD-10-CM

## 2014-12-19 DIAGNOSIS — Z3403 Encounter for supervision of normal first pregnancy, third trimester: Secondary | ICD-10-CM

## 2014-12-20 LAB — GC/CHLAMYDIA PROBE AMP
CT PROBE, AMP APTIMA: NEGATIVE
GC Probe RNA: NEGATIVE

## 2014-12-20 NOTE — Progress Notes (Signed)
Subjective:  Esha Bloxom is a 39 y.o. G1P0 at [redacted]w[redacted]d being seen today for ongoing prenatal care.  Patient reports no complaints.  Contractions: Not present.  Vag. Bleeding: None. Movement: Present. Denies leaking of fluid.   The following portions of the patient's history were reviewed and updated as appropriate: allergies, current medications, past family history, past medical history, past social history, past surgical history and problem list.   Objective:   Filed Vitals:   12/19/14 1612  BP: 128/88  Weight: 298 lb (135.172 kg)    Fetal Status: Fetal Heart Rate (bpm): 140   Movement: Present     General:  Alert, oriented and cooperative. Patient is in no acute distress.  Skin: Skin is warm and dry. No rash noted.   Cardiovascular: Normal heart rate noted  Respiratory: Normal respiratory effort, no problems with respiration noted  Abdomen: Soft, gravid, appropriate for gestational age. Pain/Pressure: Absent     Pelvic: Vag. Bleeding: None Vag D/C Character: Thin   Cervical exam performed        Extremities: Normal range of motion.  Edema: Trace  Mental Status: Normal mood and affect. Normal behavior. Normal judgment and thought content.   Urinalysis: Urine Protein: Trace Urine Glucose: Negative  Assessment and Plan:  Pregnancy: G1P0 at [redacted]w[redacted]d  1. Normal pregnancy, third trimester EFW >90%--random CBG 84 [place] growth at 38 1/2 weeks. - Culture, beta strep (group b only) - GC/Chlamydia Probe Amp - [place] MFM OB FOLLOW UP; Future  Preterm labor symptoms and general obstetric precautions including but not limited to vaginal bleeding, contractions, leaking of fluid and fetal movement were reviewed in detail with the patient. Please refer to After Visit Summary for other counseling recommendations.  1 week Small amount spotting after cervical exam.  Pt reassured.   Cervix completely closed.  Guss Bunde, MD

## 2014-12-21 LAB — CULTURE, BETA STREP (GROUP B ONLY)

## 2014-12-26 ENCOUNTER — Ambulatory Visit (INDEPENDENT_AMBULATORY_CARE_PROVIDER_SITE_OTHER): Payer: BC Managed Care – PPO | Admitting: Obstetrics & Gynecology

## 2014-12-26 VITALS — BP 118/88 | HR 80 | Wt 297.0 lb

## 2014-12-26 DIAGNOSIS — O09513 Supervision of elderly primigravida, third trimester: Secondary | ICD-10-CM

## 2014-12-26 DIAGNOSIS — O99213 Obesity complicating pregnancy, third trimester: Secondary | ICD-10-CM

## 2014-12-26 DIAGNOSIS — E669 Obesity, unspecified: Secondary | ICD-10-CM

## 2014-12-26 DIAGNOSIS — Z3493 Encounter for supervision of normal pregnancy, unspecified, third trimester: Secondary | ICD-10-CM

## 2014-12-26 NOTE — Progress Notes (Signed)
Subjective:  Carrie Gibson is a 39 y.o. G1P0 at [redacted]w[redacted]d being seen today for ongoing prenatal care.  Patient reports no complaints.  Contractions: Not present.  Vag. Bleeding: None. Movement: Present. Denies leaking of fluid.   The following portions of the patient's history were reviewed and updated as appropriate: allergies, current medications, past family history, past medical history, past social history, past surgical history and problem list.   Objective:   Filed Vitals:   12/26/14 1611  BP: 118/88  Pulse: 80  Weight: 297 lb (134.718 kg)    Fetal Status: Fetal Heart Rate (bpm): 130 Fundal Height: 46 cm Movement: Present     General:  Alert, oriented and cooperative. Patient is in no acute distress.  Skin: Skin is warm and dry. No rash noted.   Cardiovascular: Normal heart rate noted  Respiratory: Normal respiratory effort, no problems with respiration noted  Abdomen: Soft, gravid, appropriate for gestational age. Pain/Pressure: Present     Pelvic: Vag. Bleeding: None Vag D/C Character: Thin   Cervical exam deferred        Extremities: Normal range of motion.  Edema: Trace  Mental Status: Normal mood and affect. Normal behavior. Normal judgment and thought content.   Urinalysis: Urine Protein: Trace Urine Glucose: Negative  Assessment and Plan:  Pregnancy: G1P0 at [redacted]w[redacted]d  1. Advanced maternal age, 1st pregnancy, third trimester   2. Obesity affecting pregnancy in third trimester -growth u/s in 10 days  3. Supervision of normal pregnancy, third trimester   Preterm labor symptoms and general obstetric precautions including but not limited to vaginal bleeding, contractions, leaking of fluid and fetal movement were reviewed in detail with the patient. Please refer to After Visit Summary for other counseling recommendations.  Return in about 1 week (around 01/02/2015).   Emily Filbert, MD

## 2015-01-02 ENCOUNTER — Ambulatory Visit (INDEPENDENT_AMBULATORY_CARE_PROVIDER_SITE_OTHER): Payer: BC Managed Care – PPO | Admitting: Obstetrics & Gynecology

## 2015-01-02 VITALS — BP 130/86 | HR 84 | Wt 300.0 lb

## 2015-01-02 DIAGNOSIS — O3663X0 Maternal care for excessive fetal growth, third trimester, not applicable or unspecified: Secondary | ICD-10-CM

## 2015-01-02 DIAGNOSIS — IMO0002 Reserved for concepts with insufficient information to code with codable children: Secondary | ICD-10-CM

## 2015-01-02 DIAGNOSIS — Z3493 Encounter for supervision of normal pregnancy, unspecified, third trimester: Secondary | ICD-10-CM

## 2015-01-02 DIAGNOSIS — Z3403 Encounter for supervision of normal first pregnancy, third trimester: Secondary | ICD-10-CM

## 2015-01-02 DIAGNOSIS — O09513 Supervision of elderly primigravida, third trimester: Secondary | ICD-10-CM

## 2015-01-02 NOTE — Progress Notes (Signed)
Subjective:  Carrie Gibson is a 39 y.o. G1P0 at [redacted]w[redacted]d being seen today for ongoing prenatal care.  Patient reports no complaints.  Contractions: Not present.  Vag. Bleeding: None. Movement: Present. Denies leaking of fluid.   The following portions of the patient's history were reviewed and updated as appropriate: allergies, current medications, past family history, past medical history, past social history, past surgical history and problem list.   Objective:   Filed Vitals:   01/02/15 1616  BP: 130/86  Pulse: 84  Weight: 300 lb (136.079 kg)    Fetal Status: Fetal Heart Rate (bpm): 138 Fundal Height: 47 cm Movement: Present     General:  Alert, oriented and cooperative. Patient is in no acute distress.  Skin: Skin is warm and dry. No rash noted.   Cardiovascular: Normal heart rate noted  Respiratory: Normal respiratory effort, no problems with respiration noted  Abdomen: Soft, gravid, appropriate for gestational age. Pain/Pressure: Absent     Pelvic: Vag. Bleeding: None Vag D/C Character: Thin   Cervical exam performed Dilation: Closed Effacement (%): Thick Station: -3  Extremities: Normal range of motion.  Edema: Mild pitting, slight indentation  Mental Status: Normal mood and affect. Normal behavior. Normal judgment and thought content.   Urinalysis: Urine Protein: Trace Urine Glucose: Negative  Assessment and Plan:  Pregnancy: G1P0 at [redacted]w[redacted]d  1. LGA (large for gestational age) fetus - She will get an u/s next week   2. Supervision of normal pregnancy, third trimester   3. Advanced maternal age, 1st pregnancy, third trimester   Preterm labor symptoms and general obstetric precautions including but not limited to vaginal bleeding, contractions, leaking of fluid and fetal movement were reviewed in detail with the patient. Please refer to After Visit Summary for other counseling recommendations.  Return in about 1 week (around 01/09/2015).   Emily Filbert, MD

## 2015-01-07 ENCOUNTER — Ambulatory Visit (HOSPITAL_COMMUNITY): Payer: BC Managed Care – PPO

## 2015-01-08 ENCOUNTER — Ambulatory Visit (HOSPITAL_COMMUNITY)
Admission: RE | Admit: 2015-01-08 | Discharge: 2015-01-08 | Disposition: A | Discharge status: Home / Self Care | Payer: BC Managed Care – PPO | Source: Ambulatory Visit | Attending: Obstetrics & Gynecology | Admitting: Obstetrics & Gynecology

## 2015-01-08 DIAGNOSIS — Z3493 Encounter for supervision of normal pregnancy, unspecified, third trimester: Secondary | ICD-10-CM

## 2015-01-08 DIAGNOSIS — Z36 Encounter for antenatal screening of mother: Secondary | ICD-10-CM | POA: Diagnosis present

## 2015-01-09 ENCOUNTER — Ambulatory Visit (INDEPENDENT_AMBULATORY_CARE_PROVIDER_SITE_OTHER): Payer: BC Managed Care – PPO | Admitting: Obstetrics & Gynecology

## 2015-01-09 DIAGNOSIS — E669 Obesity, unspecified: Secondary | ICD-10-CM | POA: Diagnosis not present

## 2015-01-09 DIAGNOSIS — Z3403 Encounter for supervision of normal first pregnancy, third trimester: Secondary | ICD-10-CM

## 2015-01-09 DIAGNOSIS — Z3A39 39 weeks gestation of pregnancy: Secondary | ICD-10-CM

## 2015-01-09 DIAGNOSIS — O99213 Obesity complicating pregnancy, third trimester: Secondary | ICD-10-CM

## 2015-01-09 DIAGNOSIS — O3663X0 Maternal care for excessive fetal growth, third trimester, not applicable or unspecified: Secondary | ICD-10-CM

## 2015-01-09 DIAGNOSIS — Z3493 Encounter for supervision of normal pregnancy, unspecified, third trimester: Secondary | ICD-10-CM

## 2015-01-09 LAB — GLUCOSE, POCT (MANUAL RESULT ENTRY): POC Glucose: 67 mg/dl — AB (ref 70–99)

## 2015-01-09 NOTE — Progress Notes (Signed)
Subjective:  Carrie Gibson is a 39 y.o. G1P0 at [redacted]w[redacted]d being seen today for ongoing prenatal care.  Patient reports no complaints.  Contractions: Not present.  Vag. Bleeding: None. Movement: Present. Denies leaking of fluid.   The following portions of the patient's history were reviewed and updated as appropriate: allergies, current medications, past family history, past medical history, past social history, past surgical history and problem list.   Objective:   Filed Vitals:   01/09/15 1557  BP: 138/86  Pulse: 100    Fetal Status: Fetal Heart Rate (bpm): 132   Movement: Present     General:  Alert, oriented and cooperative. Patient is in no acute distress.  Skin: Skin is warm and dry. No rash noted.   Cardiovascular: Normal heart rate noted  Respiratory: Normal respiratory effort, no problems with respiration noted  Abdomen: Soft, gravid, appropriate for gestational age. Pain/Pressure: Present     Pelvic: Vag. Bleeding: None Vag D/C Character: Thin   Cervical exam deferred        Extremities: Normal range of motion.  Edema: Mild pitting, slight indentation  Mental Status: Normal mood and affect. Normal behavior. Normal judgment and thought content.   Urinalysis: Urine Protein: Trace Urine Glucose: Negative  Assessment and Plan:  Pregnancy: G1P0 at [redacted]w[redacted]d  1. Macrosomia  - POCT Glucose (CBG)  2. Obesity affecting pregnancy in third trimester - most recent u/s shows EFW greater than 4600 gram. I offered her TOL versus PLTCS. She opts for C/S. I will do this on Saturday. We discussed the risks of surgery.  3. Supervision of normal pregnancy, third trimester   Term labor symptoms and general obstetric precautions including but not limited to vaginal bleeding, contractions, leaking of fluid and fetal movement were reviewed in detail with the patient. Please refer to After Visit Summary for other counseling recommendations.  Return in about 6 weeks (around 02/20/2015).   Emily Filbert, MD

## 2015-01-10 ENCOUNTER — Encounter: Payer: Self-pay | Admitting: Obstetrics & Gynecology

## 2015-01-10 ENCOUNTER — Encounter (HOSPITAL_COMMUNITY): Payer: Self-pay | Admitting: Anesthesiology

## 2015-01-10 MED ORDER — DEXTROSE 5 % IV SOLN
3.0000 g | INTRAVENOUS | Status: DC
  Filled 2015-01-10: qty 3000

## 2015-01-10 NOTE — Anesthesia Preprocedure Evaluation (Addendum)
Anesthesia Evaluation  Patient identified by MRN, date of birth, ID band Patient awake    Reviewed: Allergy & Precautions, NPO status , Patient's Chart, lab work & pertinent test results  Airway Mallampati: III  TM Distance: >3 FB Neck ROM: Full    Dental no notable dental hx. (+) Teeth Intact   Pulmonary asthma , former smoker,    Pulmonary exam normal breath sounds clear to auscultation       Cardiovascular negative cardio ROS Normal cardiovascular exam Rhythm:Regular Rate:Normal     Neuro/Psych negative neurological ROS  negative psych ROS   GI/Hepatic Neg liver ROS, GERD  ,  Endo/Other  Morbid obesity  Renal/GU negative Renal ROS  negative genitourinary   Musculoskeletal   Abdominal (+) + obese,   Peds  Hematology   Anesthesia Other Findings   Reproductive/Obstetrics (+) Pregnancy Fetal macrosomia                             Anesthesia Physical Anesthesia Plan  ASA: III  Anesthesia Plan: Spinal   Post-op Pain Management:    Induction:   Airway Management Planned: Natural Airway  Additional Equipment:   Intra-op Plan:   Post-operative Plan:   Informed Consent: I have reviewed the patients History and Physical, chart, labs and discussed the procedure including the risks, benefits and alternatives for the proposed anesthesia with the patient or authorized representative who has indicated his/her understanding and acceptance.     Plan Discussed with: Anesthesiologist, CRNA and Surgeon  Anesthesia Plan Comments:         Anesthesia Quick Evaluation

## 2015-01-11 ENCOUNTER — Inpatient Hospital Stay (HOSPITAL_COMMUNITY)
Admission: AD | Admit: 2015-01-11 | Discharge: 2015-01-13 | DRG: 765 | Disposition: A | Discharge status: Home / Self Care | Payer: BC Managed Care – PPO | Source: Ambulatory Visit | Attending: Obstetrics & Gynecology | Admitting: Obstetrics & Gynecology

## 2015-01-11 ENCOUNTER — Inpatient Hospital Stay (HOSPITAL_COMMUNITY)
Admission: RE | Admit: 2015-01-11 | Payer: BC Managed Care – PPO | Source: Ambulatory Visit | Admitting: Obstetrics & Gynecology

## 2015-01-11 ENCOUNTER — Inpatient Hospital Stay (HOSPITAL_COMMUNITY): Payer: BC Managed Care – PPO | Admitting: Anesthesiology

## 2015-01-11 ENCOUNTER — Encounter (HOSPITAL_COMMUNITY)
Admission: AD | Disposition: A | Discharge status: Home / Self Care | Payer: Self-pay | Source: Ambulatory Visit | Attending: Obstetrics & Gynecology

## 2015-01-11 ENCOUNTER — Encounter (HOSPITAL_COMMUNITY): Payer: Self-pay | Admitting: *Deleted

## 2015-01-11 DIAGNOSIS — Z87891 Personal history of nicotine dependence: Secondary | ICD-10-CM | POA: Diagnosis not present

## 2015-01-11 DIAGNOSIS — Z6841 Body Mass Index (BMI) 40.0 and over, adult: Secondary | ICD-10-CM

## 2015-01-11 DIAGNOSIS — O3663X0 Maternal care for excessive fetal growth, third trimester, not applicable or unspecified: Secondary | ICD-10-CM | POA: Diagnosis present

## 2015-01-11 DIAGNOSIS — O99213 Obesity complicating pregnancy, third trimester: Secondary | ICD-10-CM

## 2015-01-11 DIAGNOSIS — Z3A39 39 weeks gestation of pregnancy: Secondary | ICD-10-CM | POA: Diagnosis not present

## 2015-01-11 DIAGNOSIS — IMO0002 Reserved for concepts with insufficient information to code with codable children: Secondary | ICD-10-CM

## 2015-01-11 DIAGNOSIS — J45909 Unspecified asthma, uncomplicated: Secondary | ICD-10-CM | POA: Diagnosis present

## 2015-01-11 DIAGNOSIS — O09519 Supervision of elderly primigravida, unspecified trimester: Secondary | ICD-10-CM | POA: Diagnosis not present

## 2015-01-11 DIAGNOSIS — Z3493 Encounter for supervision of normal pregnancy, unspecified, third trimester: Secondary | ICD-10-CM

## 2015-01-11 DIAGNOSIS — O09513 Supervision of elderly primigravida, third trimester: Secondary | ICD-10-CM

## 2015-01-11 DIAGNOSIS — O9952 Diseases of the respiratory system complicating childbirth: Secondary | ICD-10-CM | POA: Diagnosis present

## 2015-01-11 LAB — CBC
HEMATOCRIT: 36 % (ref 36.0–46.0)
HEMOGLOBIN: 11.8 g/dL — AB (ref 12.0–15.0)
MCH: 27.8 pg (ref 26.0–34.0)
MCHC: 32.8 g/dL (ref 30.0–36.0)
MCV: 84.7 fL (ref 78.0–100.0)
Platelets: 255 10*3/uL (ref 150–400)
RBC: 4.25 MIL/uL (ref 3.87–5.11)
RDW: 14 % (ref 11.5–15.5)
WBC: 10.5 10*3/uL (ref 4.0–10.5)

## 2015-01-11 LAB — CREATININE, SERUM: CREATININE: 0.48 mg/dL (ref 0.44–1.00)

## 2015-01-11 LAB — ABO/RH: ABO/RH(D): A POS

## 2015-01-11 LAB — TYPE AND SCREEN
ABO/RH(D): A POS
Antibody Screen: NEGATIVE

## 2015-01-11 SURGERY — Surgical Case
Anesthesia: Spinal | Site: Abdomen

## 2015-01-11 MED ORDER — PHENYLEPHRINE 8 MG IN D5W 100 ML (0.08MG/ML) PREMIX OPTIME
INJECTION | INTRAVENOUS | Status: DC | PRN
  Administered 2015-01-11: 60 ug/min via INTRAVENOUS

## 2015-01-11 MED ORDER — DIPHENHYDRAMINE HCL 25 MG PO CAPS: 25.0000 mg | ORAL_CAPSULE | Freq: Four times a day (QID) | ORAL | Status: DC | PRN

## 2015-01-11 MED ORDER — LACTATED RINGERS IV SOLN
Freq: Once | INTRAVENOUS | Status: AC
  Administered 2015-01-11: 1000 mL/h via INTRAVENOUS

## 2015-01-11 MED ORDER — CEFAZOLIN SODIUM 10 G IJ SOLR
3.0000 g | INTRAMUSCULAR | Status: DC | PRN
Start: 2015-01-11 — End: 2015-01-11
  Administered 2015-01-11: 3 g via INTRAVENOUS

## 2015-01-11 MED ORDER — DIPHENHYDRAMINE HCL 50 MG/ML IJ SOLN
12.5000 mg | INTRAMUSCULAR | Status: DC | PRN
  Administered 2015-01-11: 12.5 mg via INTRAVENOUS

## 2015-01-11 MED ORDER — LACTATED RINGERS IV SOLN
INTRAVENOUS | Status: DC | PRN
  Administered 2015-01-11 (×3): via INTRAVENOUS

## 2015-01-11 MED ORDER — NALBUPHINE HCL 10 MG/ML IJ SOLN: 5.0000 mg | INTRAMUSCULAR | Status: DC | PRN

## 2015-01-11 MED ORDER — FENTANYL CITRATE (PF) 100 MCG/2ML IJ SOLN
INTRAMUSCULAR | Status: DC | PRN
Start: 2015-01-11 — End: 2015-01-11
  Administered 2015-01-11: 25 ug via INTRATHECAL

## 2015-01-11 MED ORDER — SCOPOLAMINE 1 MG/3DAYS TD PT72
1.0000 | MEDICATED_PATCH | Freq: Once | TRANSDERMAL | Status: DC
  Administered 2015-01-11: 1.5 mg via TRANSDERMAL

## 2015-01-11 MED ORDER — SIMETHICONE 80 MG PO CHEW: 80.0000 mg | CHEWABLE_TABLET | ORAL | Status: DC | PRN

## 2015-01-11 MED ORDER — MENTHOL 3 MG MT LOZG: 1.0000 | LOZENGE | OROMUCOSAL | Status: DC | PRN

## 2015-01-11 MED ORDER — MORPHINE SULFATE (PF) 0.5 MG/ML IJ SOLN
INTRAMUSCULAR | Status: DC | PRN
  Administered 2015-01-11: .15 mg via INTRATHECAL

## 2015-01-11 MED ORDER — SIMETHICONE 80 MG PO CHEW
80.0000 mg | CHEWABLE_TABLET | Freq: Three times a day (TID) | ORAL | Status: DC
  Administered 2015-01-11 – 2015-01-13 (×5): 80 mg via ORAL
  Filled 2015-01-11 (×5): qty 1

## 2015-01-11 MED ORDER — IBUPROFEN 600 MG PO TABS
600.0000 mg | ORAL_TABLET | Freq: Four times a day (QID) | ORAL | Status: DC
  Administered 2015-01-11 – 2015-01-13 (×8): 600 mg via ORAL
  Filled 2015-01-11 (×8): qty 1

## 2015-01-11 MED ORDER — ACETAMINOPHEN 325 MG PO TABS
650.0000 mg | ORAL_TABLET | ORAL | Status: DC | PRN
  Administered 2015-01-11 – 2015-01-12 (×2): 650 mg via ORAL
  Filled 2015-01-11 (×2): qty 2

## 2015-01-11 MED ORDER — BUPIVACAINE HCL (PF) 0.5 % IJ SOLN
INTRAMUSCULAR | Status: AC
  Filled 2015-01-11: qty 30

## 2015-01-11 MED ORDER — DIPHENHYDRAMINE HCL 25 MG PO CAPS
25.0000 mg | ORAL_CAPSULE | ORAL | Status: DC | PRN
  Administered 2015-01-12: 25 mg via ORAL
  Filled 2015-01-11 (×2): qty 1

## 2015-01-11 MED ORDER — LANOLIN HYDROUS EX OINT
1.0000 | TOPICAL_OINTMENT | CUTANEOUS | Status: DC | PRN
Start: 2015-01-11 — End: 2015-01-13

## 2015-01-11 MED ORDER — NALOXONE HCL 0.4 MG/ML IJ SOLN: 0.4000 mg | INTRAMUSCULAR | Status: DC | PRN

## 2015-01-11 MED ORDER — DIBUCAINE 1 % RE OINT: 1.0000 "application " | TOPICAL_OINTMENT | RECTAL | Status: DC | PRN

## 2015-01-11 MED ORDER — ENOXAPARIN SODIUM 40 MG/0.4ML ~~LOC~~ SOLN
40.0000 mg | SUBCUTANEOUS | Status: DC
  Administered 2015-01-12 – 2015-01-13 (×2): 40 mg via SUBCUTANEOUS
  Filled 2015-01-11 (×3): qty 0.4

## 2015-01-11 MED ORDER — FENTANYL CITRATE (PF) 100 MCG/2ML IJ SOLN: 25.0000 ug | INTRAMUSCULAR | Status: DC | PRN

## 2015-01-11 MED ORDER — ONDANSETRON HCL 4 MG/2ML IJ SOLN
INTRAMUSCULAR | Status: DC | PRN
  Administered 2015-01-11: 4 mg via INTRAVENOUS

## 2015-01-11 MED ORDER — TETANUS-DIPHTH-ACELL PERTUSSIS 5-2.5-18.5 LF-MCG/0.5 IM SUSP: 0.5000 mL | Freq: Once | INTRAMUSCULAR | Status: DC

## 2015-01-11 MED ORDER — NALBUPHINE HCL 10 MG/ML IJ SOLN: 5.0000 mg | Freq: Once | INTRAMUSCULAR | Status: DC | PRN

## 2015-01-11 MED ORDER — DIPHENHYDRAMINE HCL 50 MG/ML IJ SOLN
INTRAMUSCULAR | Status: AC
  Filled 2015-01-11: qty 1

## 2015-01-11 MED ORDER — BUPIVACAINE HCL (PF) 0.5 % IJ SOLN
INTRAMUSCULAR | Status: DC | PRN
  Administered 2015-01-11: 30 mL

## 2015-01-11 MED ORDER — LACTATED RINGERS IV SOLN
INTRAVENOUS | Status: DC | PRN
  Administered 2015-01-11: 12:00:00 via INTRAVENOUS

## 2015-01-11 MED ORDER — SCOPOLAMINE 1 MG/3DAYS TD PT72
MEDICATED_PATCH | TRANSDERMAL | Status: AC
  Administered 2015-01-11: 1.5 mg via TRANSDERMAL
  Filled 2015-01-11: qty 1

## 2015-01-11 MED ORDER — PRENATAL MULTIVITAMIN CH
1.0000 | ORAL_TABLET | Freq: Every day | ORAL | Status: DC
  Administered 2015-01-12 – 2015-01-13 (×2): 1 via ORAL
  Filled 2015-01-11 (×2): qty 1

## 2015-01-11 MED ORDER — SODIUM CHLORIDE 0.9 % IJ SOLN: 3.0000 mL | INTRAMUSCULAR | Status: DC | PRN

## 2015-01-11 MED ORDER — OXYTOCIN 40 UNITS IN LACTATED RINGERS INFUSION - SIMPLE MED: 62.5000 mL/h | INTRAVENOUS | Status: AC

## 2015-01-11 MED ORDER — ONDANSETRON HCL 4 MG/2ML IJ SOLN: 4.0000 mg | Freq: Three times a day (TID) | INTRAMUSCULAR | Status: DC | PRN

## 2015-01-11 MED ORDER — LACTATED RINGERS IV SOLN
40.0000 [IU] | INTRAVENOUS | Status: DC | PRN
  Administered 2015-01-11: 40 [IU] via INTRAVENOUS

## 2015-01-11 MED ORDER — SIMETHICONE 80 MG PO CHEW
80.0000 mg | CHEWABLE_TABLET | ORAL | Status: DC
  Administered 2015-01-11 – 2015-01-12 (×2): 80 mg via ORAL
  Filled 2015-01-11 (×2): qty 1

## 2015-01-11 MED ORDER — WITCH HAZEL-GLYCERIN EX PADS: 1.0000 "application " | MEDICATED_PAD | CUTANEOUS | Status: DC | PRN

## 2015-01-11 MED ORDER — MEPERIDINE HCL 25 MG/ML IJ SOLN: 6.2500 mg | INTRAMUSCULAR | Status: DC | PRN

## 2015-01-11 MED ORDER — OXYCODONE-ACETAMINOPHEN 5-325 MG PO TABS: 2.0000 | ORAL_TABLET | ORAL | Status: DC | PRN

## 2015-01-11 MED ORDER — LACTATED RINGERS IV SOLN
INTRAVENOUS | Status: DC
  Administered 2015-01-11: 21:00:00 via INTRAVENOUS

## 2015-01-11 MED ORDER — OXYCODONE-ACETAMINOPHEN 5-325 MG PO TABS
1.0000 | ORAL_TABLET | ORAL | Status: DC | PRN
  Administered 2015-01-12 – 2015-01-13 (×3): 1 via ORAL
  Filled 2015-01-11 (×3): qty 1

## 2015-01-11 MED ORDER — NALOXONE HCL 1 MG/ML IJ SOLN: 1.0000 ug/kg/h | INTRAVENOUS | Status: DC | PRN

## 2015-01-11 MED ORDER — ZOLPIDEM TARTRATE 5 MG PO TABS: 5.0000 mg | ORAL_TABLET | Freq: Every evening | ORAL | Status: DC | PRN

## 2015-01-11 MED ORDER — BUPIVACAINE IN DEXTROSE 0.75-8.25 % IT SOLN
INTRATHECAL | Status: DC | PRN
  Administered 2015-01-11: 1.6 mL via INTRATHECAL

## 2015-01-11 MED ORDER — BUDESONIDE 0.5 MG/2ML IN SUSP
0.5000 mg | Freq: Two times a day (BID) | RESPIRATORY_TRACT | Status: DC
  Filled 2015-01-11 (×6): qty 2

## 2015-01-11 MED ORDER — DEXAMETHASONE SODIUM PHOSPHATE 10 MG/ML IJ SOLN
INTRAMUSCULAR | Status: DC | PRN
  Administered 2015-01-11: 5 mg via INTRAVENOUS

## 2015-01-11 MED ORDER — SENNOSIDES-DOCUSATE SODIUM 8.6-50 MG PO TABS
2.0000 | ORAL_TABLET | ORAL | Status: DC
  Administered 2015-01-11 – 2015-01-12 (×2): 2 via ORAL
  Filled 2015-01-11 (×2): qty 2

## 2015-01-11 SURGICAL SUPPLY — 36 items
BARRIER ADHS 3X4 INTERCEED (GAUZE/BANDAGES/DRESSINGS) IMPLANT
CLAMP CORD UMBIL (MISCELLANEOUS) IMPLANT
CLOSURE WOUND 1/2 X4 (GAUZE/BANDAGES/DRESSINGS) ×1
CLOTH BEACON ORANGE TIMEOUT ST (SAFETY) ×3 IMPLANT
CONTAINER PREFILL 10% NBF 15ML (MISCELLANEOUS) IMPLANT
DRAPE SHEET LG 3/4 BI-LAMINATE (DRAPES) IMPLANT
DRSG OPSITE POSTOP 4X10 (GAUZE/BANDAGES/DRESSINGS) ×3 IMPLANT
DURAPREP 26ML APPLICATOR (WOUND CARE) ×3 IMPLANT
ELECT REM PT RETURN 9FT ADLT (ELECTROSURGICAL) ×3
ELECTRODE REM PT RTRN 9FT ADLT (ELECTROSURGICAL) ×1 IMPLANT
EXTRACTOR VACUUM KIWI (MISCELLANEOUS) ×3 IMPLANT
GLOVE BIO SURGEON STRL SZ 6.5 (GLOVE) ×2 IMPLANT
GLOVE BIO SURGEONS STRL SZ 6.5 (GLOVE) ×1
GOWN STRL REUS W/TWL LRG LVL3 (GOWN DISPOSABLE) ×6 IMPLANT
KIT ABG SYR 3ML LUER SLIP (SYRINGE) IMPLANT
NEEDLE HYPO 25X5/8 SAFETYGLIDE (NEEDLE) IMPLANT
NEEDLE SPNL 18GX3.5 QUINCKE PK (NEEDLE) ×3 IMPLANT
NS IRRIG 1000ML POUR BTL (IV SOLUTION) ×3 IMPLANT
PACK C SECTION WH (CUSTOM PROCEDURE TRAY) ×3 IMPLANT
PAD OB MATERNITY 4.3X12.25 (PERSONAL CARE ITEMS) ×3 IMPLANT
PENCIL SMOKE EVAC W/HOLSTER (ELECTROSURGICAL) ×3 IMPLANT
STRIP CLOSURE SKIN 1/2X4 (GAUZE/BANDAGES/DRESSINGS) ×2 IMPLANT
SUT PDS AB 0 CTX 60 (SUTURE) IMPLANT
SUT VIC AB 0 CT1 27 (SUTURE)
SUT VIC AB 0 CT1 27XBRD ANBCTR (SUTURE) IMPLANT
SUT VIC AB 0 CT1 36 (SUTURE) IMPLANT
SUT VIC AB 2-0 CT1 27 (SUTURE) ×2
SUT VIC AB 2-0 CT1 TAPERPNT 27 (SUTURE) ×1 IMPLANT
SUT VIC AB 2-0 CTX 36 (SUTURE) ×6 IMPLANT
SUT VIC AB 3-0 CT1 27 (SUTURE) ×2
SUT VIC AB 3-0 CT1 TAPERPNT 27 (SUTURE) ×1 IMPLANT
SUT VIC AB 3-0 SH 27 (SUTURE)
SUT VIC AB 3-0 SH 27X BRD (SUTURE) IMPLANT
SYR 30ML LL (SYRINGE) ×3 IMPLANT
TOWEL OR 17X24 6PK STRL BLUE (TOWEL DISPOSABLE) ×3 IMPLANT
TRAY FOLEY CATH SILVER 14FR (SET/KITS/TRAYS/PACK) ×3 IMPLANT

## 2015-01-11 NOTE — Consult Note (Signed)
[facility]  Delivery Note:  C-section       01/11/2015  11:39 AM  I was called to the operating room at the request of the patient's obstetrician Elonda Husky) for a primary c-section for fetal macrosomia.  PRENATAL HX:  39 y/o G1P0 at 24 and 2/[redacted] weeks gestation.  Pregnancy complicated by obesity and macrosomia.  C-section recommended for macrosomia.   INTRAPARTUM HX:   Primary c-section with AROM at delivery  DELIVERY:  Infant was vigorous at delivery, requiring no resuscitation other than standard warming, drying and stimulation.  APGARs 8 and 9.  Exam within normal limits.  After 5 minutes, baby left with nurse to assist parents with skin-to-skin care.  Macrosomic, check blood glucose per protocol.    _____________________ Electronically Signed By: Clinton Gallant, MD Neonatologist

## 2015-01-11 NOTE — Anesthesia Postprocedure Evaluation (Signed)
  Anesthesia Post-op Note  Patient: Carrie Gibson  Procedure(s) Performed: Procedure(s): CESAREAN SECTION (N/A)  Patient Location: PACU  Anesthesia Type:Spinal  Level of Consciousness: awake, alert  and oriented  Airway and Oxygen Therapy: Patient Spontanous Breathing  Post-op Pain: none  Post-op Assessment: Post-op Vital signs reviewed, Patient's Cardiovascular Status Stable, Respiratory Function Stable, Patent Airway, No signs of Nausea or vomiting, Pain level controlled, No headache, No backache and Spinal receding LLE Motor Response: Non-purposeful movement LLE Sensation: Tingling RLE Motor Response: Non-purposeful movement RLE Sensation: Tingling      Post-op Vital Signs: Reviewed and stable  Last Vitals:  Filed Vitals:   01/11/15 1330  BP: 129/80  Pulse: 66  Temp:   Resp: 23    Complications: No apparent anesthesia complications

## 2015-01-11 NOTE — Lactation Note (Signed)
This note was copied from the chart of Carrie Gibson. Lactation Consultation Note Initial visit at 9 hours of age.  Mom reports a few good feedings, but needs help with positioning due to recovering from c/s.  Visitor changing diaper, baby sucking on paci, education done.  Demonstrated hand expression with few drops of colostrum visible.  Assisted with positioning in football hold on right breast baby STS.  Baby latched well with wide open mouth flanged lips and strong rhythmic sucking for several minutes and then stimulation needed to maintain feeding. Mom is feeling encouraged.  [facility] LC resources given and discussed.  Encouraged to feed with early cues on demand.  Early newborn behavior discussed.  Mom to call for assist as needed.     Patient Name: [name] [id number] Date: 01/11/2015 Reason for consult: Initial assessment   Maternal Data Has patient been taught Hand Expression?: Yes Does the patient have breastfeeding experience prior to this delivery?: No  Feeding Feeding Type: Breast Fed Length of feed:  (observed 10 minutes)  LATCH Score/Interventions Latch: Repeated attempts needed to sustain latch, nipple held in mouth throughout feeding, stimulation needed to elicit sucking reflex. Intervention(s): Adjust position;Assist with latch;Breast massage;Breast compression  Audible Swallowing: A few with stimulation Intervention(s): Skin to skin;Hand expression;Alternate breast massage  Type of Nipple: Everted at rest and after stimulation  Comfort (Breast/Nipple): Soft / non-tender  Problem noted: Mild/Moderate discomfort Interventions (Mild/moderate discomfort): Hand massage;Hand expression  Hold (Positioning): Assistance needed to correctly position infant at breast and maintain latch. Intervention(s): Breastfeeding basics reviewed;Support Pillows;Position options;Skin to skin  LATCH Score: 7  Lactation Tools Discussed/Used WIC Program: No   Consult Status Consult Status:  Follow-up Date: 01/12/15 Follow-up type: In-patient    Justice Britain 01/11/2015, 9:34 PM

## 2015-01-11 NOTE — Transfer of Care (Signed)
Immediate Anesthesia Transfer of Care Note  Patient: Carrie Gibson  Procedure(s) Performed: Procedure(s): CESAREAN SECTION (N/A)  Patient Location: PACU  Anesthesia Type:Spinal  Level of Consciousness: awake, alert  and oriented  Airway & Oxygen Therapy: Patient Spontanous Breathing  Post-op Assessment: Post -op Vital signs reviewed and stable  Post vital signs: Reviewed and stable  Last Vitals:  Filed Vitals:   01/11/15 1221  BP:   Pulse: 84  Temp: 36.5 C  Resp:     Complications: No apparent anesthesia complications

## 2015-01-11 NOTE — Op Note (Addendum)
01/11/2015  12:05 PM  PATIENT:  Carrie Gibson  39 y.o. female  PRE-OPERATIVE DIAGNOSIS:  Suspected macrosomia, maternal morbid obesity  POST-OPERATIVE DIAGNOSIS:  same  PROCEDURE:  Procedure(s): CESAREAN SECTION (N/A)  SURGEON:  Surgeon(s) and Role:    * Emily Filbert, MD - Primary    * Florian Buff, MD - Assisting   ANESTHESIA:   spinal  EBL:  Total I/O In: 2200 [I.V.:2200] Out: 850 [Urine:250; Blood:600]  BLOOD ADMINISTERED:none  DRAINS: none   LOCAL MEDICATIONS USED:  MARCAINE     FINDINGS: Living female infant, weight 9 pounds 10 ounces, normal pelvic anatomy, intact anterior placenta, loose nuchal cord    DISPOSITION OF SPECIMEN:  Source of Specimen:  cord blood  COUNTS:  YES  TOURNIQUET:  * No tourniquets in log *  DICTATION: .Dragon Dictation  PLAN OF CARE: Admit to inpatient   PATIENT DISPOSITION:  PACU - hemodynamically stable.   Delay start of Pharmacological VTE agent (>24hrs) due to surgical blood loss or risk of bleeding: not applicable  The risks, benefits, and alternatives of surgery were explained, understood, accepted. Consents were signed. All questions were answered. In the operating room spinal anesthesia was applied without complication. Her abdomen and vagina were prepped and draped in the usual sterile fashion. A Traxi tape was used to elevate her panus.  A Foley catheter was placed, draining clear urine throughout case. Timeout procedure was done. After adequate anesthesia was assured 30 mL for 0.5% Marcaine was injected into the subcutaneous tissue at the site of her previous cesarean. An incision was made through the previous incision. The incision was carried down through the subcutaneous tissue to the fascia. The fascia was scored the midline and extended bilaterally. The rectus muscles were apart (diathesis) and we manually pulled them further apart to visualize the uterus. Excellent hemostasis was maintained. The peritoneum was entered with  hemostats. Peritoneal incision was extended bilaterally with the Bovie. The bladder blade was placed. A transverse incision was made on the poorly-developed lower uterine segment. The uterine incision was extended with traction on each side. Amniotomy was performed with a hemostat. Clear fluid was noted.. The baby was delivered from a vertex presentation.the mouth and nostrils were suctioned prior to delivery of the shoulders. A nuchal cord was reduced prior to delivery of the shoulders. The baby's cord was clamped and cut and was transferred to the NICU personnel for routine care. The placenta was delivered intact with traction. The uterus was left in situ and the interior was cleaned with a dry lap sponge. The uterine incision was closed with 2 layers of 2-0 Vicryl running locking suture, the second layer imbricating the first. Excellent hemostasis was noted. By tilting the uterus each side was able to visualize the adnexa, and they were normal. The rectus fascia rectus muscles were noted be hemostatic as well. The fascia was closed with a #0 PDS loop in a running nonlocking fashion. No defects were palpable. The subcutaneous tissue was irrigated, clean, and dried. A subcuticular closure was done with a 3-0 Vicryl suture. Steri-Strips are placed. She was taken to the recovery room in stable condition. She tolerated the procedure well.

## 2015-01-11 NOTE — Anesthesia Procedure Notes (Signed)
Spinal Patient location during procedure: OR Start time: 01/11/2015 11:24 AM Staffing Anesthesiologist: Josephine Igo Performed by: anesthesiologist  Preanesthetic Checklist Completed: patient identified, site marked, surgical consent, pre-op evaluation, timeout performed, IV checked, risks and benefits discussed and monitors and equipment checked Spinal Block Patient position: sitting Prep: site prepped and draped and DuraPrep Patient monitoring: heart rate, cardiac monitor, continuous pulse ox and blood pressure Approach: midline Location: L3-4 Injection technique: single-shot Needle Needle type: Sprotte  Needle gauge: 24 G Needle length: 9 cm Needle insertion depth: 8 cm Assessment Sensory level: T4 Additional Notes Patient tolerated procedure well. Adequate sensory level.

## 2015-01-11 NOTE — H&P (Signed)
Dorothyann Rauen is a 39 y.o. female presenting for PLTCS at 65 1/2 weeks. History OB History    Gravida Para Term Preterm AB TAB SAB Ectopic Multiple Living   1              Past Medical History  Diagnosis Date  . Asthma   . Environmental allergies    Past Surgical History  Procedure Laterality Date  . Foot surgery  1995   Family History: family history includes Fibroids in her mother and sister; Mental illness in her father. Social History:  reports that she has quit smoking. She does not have any smokeless tobacco history on file. She reports that she does not drink alcohol or use illicit drugs.   Prenatal Transfer Tool  Maternal Diabetes: No Genetic Screening: Normal Maternal Ultrasounds/Referrals: Normal Fetal Ultrasounds or other Referrals:  None Maternal Substance Abuse:  No Significant Maternal Medications:  None Significant Maternal Lab Results:  None Other Comments:  None  ROS    Last menstrual period 04/11/2014. Exam Physical Exam  WNWHmorbidly obese WFNAD FH-47 Heart- rrr Lungs- CTAB Abd- benign Prenatal labs: ABO, Rh: A/POS/-- (02/26 0944) Antibody: NEG (02/26 0944) Rubella: 1.36 (02/26 0944) RPR: NON REAC (07/18 1018)  HBsAg: NEGATIVE (02/26 0944)  HIV: NONREACTIVE (07/18 1018)  GBS:   negative  Assessment/Plan: 39 1/[redacted] weeks EGA with suspected macrosomia. She opts for a PLTCS.  She understands the risks of surgery, including, but not to infection, bleeding, DVTs, damage to bowel, bladder, ureters. She wishes to proceed.      Daleisa Halperin C. 01/11/2015, 10:09 AM

## 2015-01-12 ENCOUNTER — Encounter (HOSPITAL_COMMUNITY): Payer: Self-pay | Admitting: Women's Health

## 2015-01-12 LAB — CBC
HCT: 29.6 % — ABNORMAL LOW (ref 36.0–46.0)
HEMOGLOBIN: 9.7 g/dL — AB (ref 12.0–15.0)
MCH: 27.9 pg (ref 26.0–34.0)
MCHC: 32.8 g/dL (ref 30.0–36.0)
MCV: 85.1 fL (ref 78.0–100.0)
Platelets: 174 10*3/uL (ref 150–400)
RBC: 3.48 MIL/uL — AB (ref 3.87–5.11)
RDW: 14.2 % (ref 11.5–15.5)
WBC: 12.5 10*3/uL — ABNORMAL HIGH (ref 4.0–10.5)

## 2015-01-12 LAB — RPR: RPR: NONREACTIVE

## 2015-01-12 NOTE — Progress Notes (Signed)
Post Op Day 1  Subjective:  Carrie Gibson is a 39 y.o. G1P1001 [redacted]w[redacted]d s/p PLTCS for macrosomia.  No acute events overnight.  Pt denies problems with ambulating, voiding or po intake.  She denies nausea or vomiting.  Pain is well controlled.  She has had flatus. She has not had bowel movement.  Lochia Small.  Plan for birth control is none.  Method of Feeding: Breast  Objective: BP 105/86 mmHg  Pulse 91  Temp(Src) 98.2 F (36.8 C) (Oral)  Resp 18  Ht 5\' 5"  (1.651 m)  Wt 136.079 kg (300 lb)  BMI 49.92 kg/m2  SpO2 95%  LMP 04/11/2014  Breastfeeding? Unknown  Physical Exam:  General: alert, cooperative and no distress. Obese  Lochia:normal flow Chest: CTAB Heart: RRR no m/r/g Abdomen: +BS, soft, nontender, fundus firm below umbilicus. Transverse surgical incision w/o erythema or significant drainage DVT Evaluation: No evidence of DVT seen on physical exam. Extremities: minimal edema (patient says this is significantly improved   Recent Labs  01/11/15 1015 01/12/15 0450  HGB 11.8* 9.7*  HCT 36.0 29.6*    Assessment/Plan:  ASSESSMENT: Carrie Gibson is a 39 y.o. G1P1000 [redacted]w[redacted]d pod #1 s/p PLTCS doing well.   Plan for discharge tomorrow, Breastfeeding and Lactation consult   LOS: 1 day   Georges Lynch 01/12/2015, 7:18 AM

## 2015-01-12 NOTE — Anesthesia Postprocedure Evaluation (Signed)
  Anesthesia Post-op Note  Patient: Carrie Gibson  Procedure(s) Performed: Procedure(s): CESAREAN SECTION (N/A)  Patient Location: Mother/Baby  Anesthesia Type:Spinal  Level of Consciousness: awake, alert , oriented and patient cooperative  Airway and Oxygen Therapy: Patient Spontanous Breathing  Post-op Pain: mild  Post-op Assessment: Post-op Vital signs reviewed, Patient's Cardiovascular Status Stable, Respiratory Function Stable, Patent Airway, No signs of Nausea or vomiting, Pain level controlled, No headache and No backache LLE Motor Response: Purposeful movement LLE Sensation: Tingling RLE Motor Response: Purposeful movement RLE Sensation: Tingling      Post-op Vital Signs: Reviewed and stable  Last Vitals:  Filed Vitals:   01/12/15 0800  BP: 115/78  Pulse: 72  Temp: 36.7 C  Resp: 20    Complications: No apparent anesthesia complications

## 2015-01-12 NOTE — Lactation Note (Signed)
This note was copied from the chart of [place]. Lactation Consultation Note  Patient Name: Carrie Gibson [id number]'M Date: 01/12/2015 Reason for consult: Follow-up assessment Mom reports feeding are going well now. Right after baby was circ he would not eat and they got worried. Now baby seems to be cluster feeding. Assured them this is normal, not to give the pacifier but offer the breast. They did not seem to take that message well. FOB is trying to help latch, showed him some better options for hand placement and breast support. Encouraged 8 or more in 24 and no artificial nipples. Talked about milk transition, belly size, engorgement, and breast care. Mom is aware of support group and O/P lactation services.   Maternal Data    Feeding Feeding Type: Breast Fed Length of feed: 10 min  LATCH Score/Interventions Latch: Grasps breast easily, tongue down, lips flanged, rhythmical sucking. Intervention(s): Adjust position  Audible Swallowing: Spontaneous and intermittent Intervention(s): Hand expression  Type of Nipple: Everted at rest and after stimulation  Comfort (Breast/Nipple): Soft / non-tender  Interventions (Mild/moderate discomfort): Hand massage  Hold (Positioning): Assistance needed to correctly position infant at breast and maintain latch. Intervention(s): Support Pillows  LATCH Score: 9  Lactation Tools Discussed/Used     Consult Status Consult Status: Follow-up Date: 01/13/15 Follow-up type: In-patient    Denzil Hughes 01/12/2015, 10:26 PM

## 2015-01-12 NOTE — Addendum Note (Signed)
Addendum  created 01/12/15 0912 by Brock Ra, CRNA   Modules edited: Notes Section   Notes Section:  File: [id number]

## 2015-01-13 ENCOUNTER — Encounter (HOSPITAL_COMMUNITY): Payer: Self-pay | Admitting: Obstetrics & Gynecology

## 2015-01-13 LAB — CBC
HEMATOCRIT: 27.6 % — AB (ref 36.0–46.0)
Hemoglobin: 9 g/dL — ABNORMAL LOW (ref 12.0–15.0)
MCH: 27.9 pg (ref 26.0–34.0)
MCHC: 32.6 g/dL (ref 30.0–36.0)
MCV: 85.4 fL (ref 78.0–100.0)
PLATELETS: 166 10*3/uL (ref 150–400)
RBC: 3.23 MIL/uL — ABNORMAL LOW (ref 3.87–5.11)
RDW: 14.4 % (ref 11.5–15.5)
WBC: 10 10*3/uL (ref 4.0–10.5)

## 2015-01-13 MED ORDER — IBUPROFEN 600 MG PO TABS: 600.0000 mg | ORAL_TABLET | Freq: Four times a day (QID) | ORAL | Status: DC

## 2015-01-13 MED ORDER — OXYCODONE-ACETAMINOPHEN 5-325 MG PO TABS: 2.0000 | ORAL_TABLET | ORAL | Status: DC | PRN

## 2015-01-13 NOTE — Discharge Summary (Signed)
OB Discharge Summary  Patient Name: Carrie Gibson DOB: 1975-12-03 MRN: [id number]  Date of admission: 01/11/2015 Delivering MD:     Date of discharge: 01/13/2015  Admitting diagnosis: 39wks, Induction macrosomia Intrauterine pregnancy: [redacted]w[redacted]d     Secondary diagnosis: None     Discharge diagnosis: Term Pregnancy Delivered                                                                                                Post partum procedures:none  Augmentation: none  Complications: None  Hospital course:  Sceduled C/S   39 y.o. yo G1P1001 at [redacted]w[redacted]d was admitted to the hospital 01/11/2015 for scheduled cesarean section with the following indication:Macrosomia.  Membrane Rupture Time/Date: 11:38 AM ,01/11/2015   Patient delivered a Viable infant.01/11/2015  Details of operation can be found in separate operative note.  Pateint had an uncomplicated postpartum course.  She is ambulating, tolerating a regular diet, passing flatus, and urinating well. Patient is discharged home in stable condition on No discharge date for patient encounter.          Physical exam  Filed Vitals:   01/12/15 0800 01/12/15 1200 01/12/15 1831 01/13/15 0600  BP: 115/78 121/73 122/73 117/69  Pulse: 72 67 71 79  Temp: 98 F (36.7 C)  98.4 F (36.9 C) 98.8 F (37.1 C)  TempSrc: Oral  Oral   Resp: 20 20 20 18   Height:      Weight:      SpO2:   98%    General: alert, cooperative and no distress Lochia: appropriate Uterine Fundus: firm Incision: Healing well with no significant drainage, No significant erythema, Dressing is clean, dry, and intact DVT Evaluation: No evidence of DVT seen on physical exam. Negative Homan's sign. No cords or calf tenderness. Labs: Lab Results  Component Value Date   WBC 10.0 01/13/2015   HGB 9.0* 01/13/2015   HCT 27.6* 01/13/2015   MCV 85.4 01/13/2015   PLT 166 01/13/2015   CMP Latest Ref Rng 01/11/2015  Glucose 70 - 99 mg/dL -  BUN 6 - 23 mg/dL -  Creatinine 0.44 -  1.00 mg/dL 0.48  Sodium 135 - 145 mEq/L -  Potassium 3.5 - 5.3 mEq/L -  Chloride 96 - 112 mEq/L -  CO2 19 - 32 mEq/L -  Calcium 8.4 - 10.5 mg/dL -  Total Protein 6.0 - 8.3 g/dL -  Total Bilirubin 0.3 - 1.2 mg/dL -  Alkaline Phos 39 - 117 U/L -  AST 0 - 37 U/L -  ALT 0 - 35 U/L -    Discharge instruction: per After Visit Summary and "Baby and Me Booklet".  Medications:  Current facility-administered medications:  .  acetaminophen (TYLENOL) tablet 650 mg, 650 mg, Oral, Q4H PRN, Emily Filbert, MD, 650 mg at 01/12/15 0808 .  budesonide (PULMICORT) nebulizer solution 0.5 mg, 0.5 mg, Nebulization, BID, Emily Filbert, MD .  witch hazel-glycerin (TUCKS) pad 1 application, 1 application, Topical, PRN **AND** dibucaine (NUPERCAINAL) 1 % rectal ointment 1 application, 1 application, Rectal, PRN, Emily Filbert, MD .  diphenhydrAMINE (BENADRYL) capsule 25 mg, 25 mg, Oral, Q6H PRN, Emily Filbert, MD .  diphenhydrAMINE (BENADRYL) injection 12.5 mg, 12.5 mg, Intravenous, Q4H PRN, 12.5 mg at 01/11/15 1431 **OR** diphenhydrAMINE (BENADRYL) capsule 25 mg, 25 mg, Oral, Q4H PRN, Josephine Igo, MD, 25 mg at 01/12/15 0459 .  enoxaparin (LOVENOX) injection 40 mg, 40 mg, Subcutaneous, Q24H, Emily Filbert, MD, 40 mg at 01/13/15 0729 .  ibuprofen (ADVIL,MOTRIN) tablet 600 mg, 600 mg, Oral, 4 times per day, Emily Filbert, MD, 600 mg at 01/13/15 0619 .  lactated ringers infusion, , Intravenous, Continuous, Emily Filbert, MD, Last Rate: 125 mL/hr at 01/11/15 2120 .  lanolin ointment 1 application, 1 application, Topical, PRN, Emily Filbert, MD .  menthol-cetylpyridinium (CEPACOL) lozenge 3 mg, 1 lozenge, Oral, Q2H PRN, Myra C Dove, MD .  nalbuphine (NUBAIN) injection 5 mg, 5 mg, Intravenous, Q4H PRN **OR** nalbuphine (NUBAIN) injection 5 mg, 5 mg, Subcutaneous, Q4H PRN, Josephine Igo, MD .  nalbuphine (NUBAIN) injection 5 mg, 5 mg, Intravenous, Once PRN **OR** nalbuphine (NUBAIN) injection 5 mg, 5 mg, Subcutaneous, Once PRN,  Josephine Igo, MD .  naloxone [facility]) 2 mg in dextrose 5 % 250 mL infusion, 1-4 mcg/kg/hr, Intravenous, Continuous PRN, Josephine Igo, MD .  naloxone [facility]) injection 0.4 mg, 0.4 mg, Intravenous, PRN **AND** sodium chloride 0.9 % injection 3 mL, 3 mL, Intravenous, PRN, Josephine Igo, MD .  ondansetron [facility]) injection 4 mg, 4 mg, Intravenous, Q8H PRN, Josephine Igo, MD .  oxyCODONE-acetaminophen (PERCOCET/ROXICET) 5-325 MG per tablet 1 tablet, 1 tablet, Oral, Q4H PRN, Emily Filbert, MD, 1 tablet at 01/13/15 0729 .  oxyCODONE-acetaminophen (PERCOCET/ROXICET) 5-325 MG per tablet 2 tablet, 2 tablet, Oral, Q4H PRN, Emily Filbert, MD .  prenatal multivitamin tablet 1 tablet, 1 tablet, Oral, Q1200, Emily Filbert, MD, 1 tablet at 01/12/15 1148 .  scopolamine (TRANSDERM-SCOP) 1 MG/3DAYS 1.5 mg, 1 patch, Transdermal, Once, Josephine Igo, MD, 1.5 mg at 01/11/15 1037 .  senna-docusate (Senokot-S) tablet 2 tablet, 2 tablet, Oral, Q24H, Emily Filbert, MD, 2 tablet at 01/12/15 2313 .  simethicone (MYLICON) chewable tablet 80 mg, 80 mg, Oral, TID PC, Emily Filbert, MD, 80 mg at 01/13/15 0729 .  simethicone (MYLICON) chewable tablet 80 mg, 80 mg, Oral, Q24H, Emily Filbert, MD, 80 mg at 01/12/15 2313 .  simethicone (MYLICON) chewable tablet 80 mg, 80 mg, Oral, PRN, Emily Filbert, MD .  Tdap (BOOSTRIX) injection 0.5 mL, 0.5 mL, Intramuscular, Once, Myra C Dove, MD .  zolpidem (AMBIEN) tablet 5 mg, 5 mg, Oral, QHS PRN, Emily Filbert, MD  Diet: carb modified diet  Activity: Advance as tolerated. Pelvic rest for 6 weeks.   Outpatient follow up:6 weeks  Postpartum contraception: Undecided  Newborn Data: Live born female  Birth Weight: 9 lb 8.7 oz (4330 g) APGAR: 8, 9  Baby Feeding: Breast Disposition:home with mother  01/13/2015 Abbott Pao, CNM

## 2015-01-23 ENCOUNTER — Encounter (HOSPITAL_COMMUNITY): Payer: Self-pay | Admitting: *Deleted

## 2015-01-27 ENCOUNTER — Encounter: Payer: Self-pay | Admitting: Obstetrics & Gynecology

## 2015-01-29 ENCOUNTER — Encounter (HOSPITAL_COMMUNITY): Payer: Self-pay | Admitting: *Deleted

## 2015-01-29 ENCOUNTER — Encounter: Payer: Self-pay | Admitting: Obstetrics & Gynecology

## 2015-01-29 ENCOUNTER — Ambulatory Visit (INDEPENDENT_AMBULATORY_CARE_PROVIDER_SITE_OTHER): Payer: BC Managed Care – PPO | Admitting: Obstetrics & Gynecology

## 2015-01-29 ENCOUNTER — Ambulatory Visit (INDEPENDENT_AMBULATORY_CARE_PROVIDER_SITE_OTHER): Payer: BC Managed Care – PPO

## 2015-01-29 ENCOUNTER — Inpatient Hospital Stay (HOSPITAL_COMMUNITY)
Admission: AD | Admit: 2015-01-29 | Discharge: 2015-01-29 | Disposition: A | Discharge status: Home / Self Care | Payer: BC Managed Care – PPO | Source: Ambulatory Visit | Attending: Family Medicine | Admitting: Family Medicine

## 2015-01-29 VITALS — BP 130/80 | HR 84 | Temp 97.2°F | Resp 16 | Wt 273.0 lb

## 2015-01-29 DIAGNOSIS — O8600 Infection of obstetric surgical wound, unspecified: Secondary | ICD-10-CM

## 2015-01-29 DIAGNOSIS — Y838 Other surgical procedures as the cause of abnormal reaction of the patient, or of later complication, without mention of misadventure at the time of the procedure: Secondary | ICD-10-CM | POA: Insufficient documentation

## 2015-01-29 DIAGNOSIS — J45909 Unspecified asthma, uncomplicated: Secondary | ICD-10-CM | POA: Insufficient documentation

## 2015-01-29 DIAGNOSIS — Y92239 Unspecified place in hospital as the place of occurrence of the external cause: Secondary | ICD-10-CM | POA: Insufficient documentation

## 2015-01-29 DIAGNOSIS — O9089 Other complications of the puerperium, not elsewhere classified: Secondary | ICD-10-CM | POA: Insufficient documentation

## 2015-01-29 DIAGNOSIS — Z87891 Personal history of nicotine dependence: Secondary | ICD-10-CM | POA: Insufficient documentation

## 2015-01-29 DIAGNOSIS — R1900 Intra-abdominal and pelvic swelling, mass and lump, unspecified site: Secondary | ICD-10-CM | POA: Diagnosis not present

## 2015-01-29 DIAGNOSIS — Z98891 History of uterine scar from previous surgery: Secondary | ICD-10-CM | POA: Diagnosis not present

## 2015-01-29 DIAGNOSIS — L7634 Postprocedural seroma of skin and subcutaneous tissue following other procedure: Secondary | ICD-10-CM | POA: Diagnosis present

## 2015-01-29 DIAGNOSIS — T819XXA Unspecified complication of procedure, initial encounter: Secondary | ICD-10-CM

## 2015-01-29 DIAGNOSIS — B356 Tinea cruris: Secondary | ICD-10-CM

## 2015-01-29 DIAGNOSIS — L7631 Postprocedural hematoma of skin and subcutaneous tissue following a dermatologic procedure: Secondary | ICD-10-CM

## 2015-01-29 DIAGNOSIS — Z4889 Encounter for other specified surgical aftercare: Secondary | ICD-10-CM | POA: Diagnosis not present

## 2015-01-29 DIAGNOSIS — D259 Leiomyoma of uterus, unspecified: Secondary | ICD-10-CM | POA: Diagnosis not present

## 2015-01-29 LAB — CBC
HCT: 33.8 % — ABNORMAL LOW (ref 36.0–46.0)
Hemoglobin: 11.1 g/dL — ABNORMAL LOW (ref 12.0–15.0)
MCH: 27.5 pg (ref 26.0–34.0)
MCHC: 32.8 g/dL (ref 30.0–36.0)
MCV: 83.7 fL (ref 78.0–100.0)
PLATELETS: 390 10*3/uL (ref 150–400)
RBC: 4.04 MIL/uL (ref 3.87–5.11)
RDW: 13 % (ref 11.5–15.5)
WBC: 9.2 10*3/uL (ref 4.0–10.5)

## 2015-01-29 MED ORDER — OXYCODONE-ACETAMINOPHEN 5-325 MG PO TABS
1.0000 | ORAL_TABLET | Freq: Once | ORAL | Status: AC
Start: 2015-01-29 — End: 2015-01-29
  Administered 2015-01-29: 1 via ORAL
  Filled 2015-01-29: qty 1

## 2015-01-29 MED ORDER — IOHEXOL 300 MG/ML  SOLN
100.0000 mL | Freq: Once | INTRAMUSCULAR | Status: AC | PRN
  Administered 2015-01-29: 100 mL via INTRAVENOUS

## 2015-01-29 MED ORDER — TERBINAFINE HCL 1 % EX CREA: 1.0000 "application " | TOPICAL_CREAM | Freq: Two times a day (BID) | CUTANEOUS | Status: DC

## 2015-01-29 MED ORDER — OXYCODONE-ACETAMINOPHEN 5-325 MG PO TABS: 1.0000 | ORAL_TABLET | Freq: Four times a day (QID) | ORAL | Status: DC | PRN

## 2015-01-29 NOTE — Discharge Instructions (Signed)
Seroma  A seroma is a collection of fluid that looks like swelling or a mass on the body. Seromas form on the body where tissue has been injured or cut. They are most common after surgeries. Seromas vary in size. Some are small and painless. Others may become large and cause pain or discomfort. Many seromas go away on their own; the fluid is naturally absorbed by the body. Some may require the fluid to be drained through medical procedures.   CAUSES   Seromas form as the result of damage to tissue or the removal of tissue. This tissue damage may occur during surgery or because of an injury or trauma. When tissue is disrupted or removed, empty space is created. The body's natural defense system causes fluid to enter the empty space and form a seroma.  SYMPTOMS   · Swelling at the site of a surgical cut (incision) or an injury.  · Drainage of clear fluid at the surgery or injury site.  · Possible discomfort or pain.  DIAGNOSIS   Your health care provider will perform a physical exam. During the exam, the health care provider will press on the seroma using a hand or fingers (palpation). Various tests may be ordered to help confirm the diagnosis. These tests may include:  · Blood tests.  · Imaging tests such as ultrasonography or computed tomography (CT).  TREATMENT   Sometimes seromas resolve on their own and drain naturally in the body. Your health care provider may monitor you to make sure the seroma does not cause any complications.  If your seroma does not resolve on its own, treatment may include:  · Using a needle to drain the fluid from the seroma (needle aspiration).  · Inserting a flexible tube (catheter) to drain the fluid.  · Applying a dressing, such as an elastic bandage or binder.  · Use of antibiotic medicines if the seroma becomes infected.  · In rare cases, surgery may be done to remove the seroma and repair the area.  HOME CARE INSTRUCTIONS  · Follow your health care provider's instructions regarding  activity levels and any limitations on movements.  · Only take over-the-counter or prescription medicines as directed by your health care provider.  · If your health care provider prescribes antibiotics, take them as directed. Finish them even if you start to feel better.  · Check your seroma every day for redness, warmth, or yellow drainage.  · Follow up with your health care provider as directed.  SEEK MEDICAL CARE IF:  · You develop a fever.  · You have pain, tenderness, redness, or warmth at the site of the seroma.  · You notice yellow drainage coming from the site of the seroma.  · Your seroma is getting bigger.     This information is not intended to replace advice given to you by your health care provider. Make sure you discuss any questions you have with your health care provider.     Document Released: 07/24/2012 Document Revised: 04/19/2014 Document Reviewed: 07/24/2012  Elsevier Interactive Patient Education ©2016 [employer]

## 2015-01-29 NOTE — MAU Note (Signed)
Pt had C/S on 10/1.  Pt noted swelling at her incision & went to MD office today.  Swelling & pain had been getting worse.  Pt went for CT scan, then told to come to MAU.  Pocket of fluid @ incision per pt.

## 2015-01-29 NOTE — MAU Note (Signed)
S/p c-section with swelling and pain @ incision site, redness

## 2015-01-29 NOTE — Progress Notes (Signed)
   CLINIC ENCOUNTER NOTE  History:  39 y.o. G1P1001 here today for postoperative wound check s/p cesarean section on 01/11/15.  She reports having significant erythema, tenderness and swelling around incision; also does not feel well.  Denies any fever, N/V,  abnormal vaginal discharge, bleeding, pelvic pain or other concerns.   Past Medical History  Diagnosis Date  . Asthma   . Environmental allergies     Past Surgical History  Procedure Laterality Date  . Foot surgery  1995  . Cesarean section N/A 01/11/2015    Procedure: CESAREAN SECTION;  Surgeon: Emily Filbert, MD;  Location: [facility];  Service: Obstetrics;  Laterality: N/A;    The following portions of the patient's history were reviewed and updated as appropriate: allergies, current medications, past family history, past medical history, past social history, past surgical history and problem list.   Health Maintenance:  Normal pap and negative HRHPV on 06/07/14.   Review of Systems:  Pertinent items noted in HPI and remainder of comprehensive ROS otherwise negative.  Objective:  Physical Exam BP 130/80 mmHg  Pulse 84  Temp(Src) 97.2 F (36.2 C) (Oral)  Resp 16  Wt 273 lb (123.832 kg)  Breastfeeding? Yes CONSTITUTIONAL: Well-developed, well-nourished female in no acute distress.  HENT:  Normocephalic, atraumatic. External right and left ear normal. Oropharynx is clear and moist EYES: Conjunctivae and EOM are normal. Pupils are equal, round, and reactive to light. No scleral icterus.  NECK: Normal range of motion, supple, no masses NEUROLGIC: Alert and oriented to person, place, and time. Normal reflexes, muscle tone coordination. No cranial nerve deficit noted. PSYCHIATRIC: Normal mood and affect. Normal behavior. Normal judgment and thought content. CARDIOVASCULAR: Normal heart rate noted RESPIRATORY: Effort and breath sounds normal, no problems with respiration noted PELVIC: Deferred MUSCULOSKELETAL: Normal range of  motion. No edema noted. ABDOMEN: Soft, obese, no distention noted.  20 x 20 x 15 cm indurated collection noted encompassing incision with some erythema.  Very tender to touch or move.  No drainage noted.  Incision is clean, steristrips removed. No open parts of incision.  Erythematous macules noted at juncture of left lower abdomen/on left upper, inner thigh concerning for candidiasis.    Assessment & Plan:  Encounter for postoperative wound check S/P cesarean section Cesarean wound infection - CT Pelvis W Contrast; Future ordered to evaluate for cellulitis vs abscess.   Will follow up results and manage accordingly. Patient is aware that management can involve I&D, antibiotic therapy or other management.  NPO for now advised. Nystatin will be prescribed for her left upper, inner thigh thigh infection Report will be called to Dr. Kennon Rounds, OB/GYN Attending on call at [facility] who will decide on the appropriate management and follow up.  This plan was discussed with patient and her mother.   Total face-to-face time with patient: 25 minutes. Over 50% of encounter was spent on counseling and coordination of care.   Verita Schneiders, MD, [place] Attending Obstetrician & Gynecologist, [place] for [facility]

## 2015-01-29 NOTE — MAU Provider Note (Signed)
OB/GYN Attending MAU Note  Chief Complaint: Wound Check    First Provider Initiated Contact with Patient 01/29/15 [place] is a 39 y.o. G1P1001 who presents with probable wound seroma. Patient reports increasing pain and swelling over last 24 hours. CT scanning showed 16.8 x 8.3 x 11.4 cm fluid collection c/w seroma.   Past Medical History  Diagnosis Date  . Asthma   . Environmental allergies    OB History  Gravida Para Term Preterm AB SAB TAB Ectopic Multiple Living  1 1 1       0 1    # Outcome Date GA Lbr Len/2nd Weight Sex Delivery Anes PTL Lv  1 Term 01/11/15 [redacted]w[redacted]d  9 lb 8.7 oz (4.33 kg) M CS-Vac Spinal  Y     Past Surgical History  Procedure Laterality Date  . Foot surgery  1995  . Cesarean section N/A 01/11/2015    Procedure: CESAREAN SECTION;  Surgeon: Emily Filbert, MD;  Location: [place] [facility];  Service: Obstetrics;  Laterality: N/A;   Social History   Social History  . Marital Status: Married    Spouse Name: N/A  . Number of Children: N/A  . Years of Education: N/A   Occupational History  . Not on file.   Social History Main Topics  . Smoking status: Former Research scientist (life sciences)  . Smokeless tobacco: Not on file  . Alcohol Use: No  . Drug Use: No  . Sexual Activity:    Partners: Male   Other Topics Concern  . Not on file   Social History Narrative   No current facility-administered medications on file prior to encounter.   Current Outpatient Prescriptions on File Prior to Encounter  Medication Sig Dispense Refill  . ibuprofen (ADVIL,MOTRIN) 600 MG tablet Take 1 tablet (600 mg total) by mouth every 6 (six) hours. 30 tablet 0  . oxyCODONE-acetaminophen (PERCOCET/ROXICET) 5-325 MG tablet Take 2 tablets by mouth every 4 (four) hours as needed (for pain scale greater than 7). 30 tablet 0  . Prenatal Vit-Fe Fumarate-FA (PRENATAL VITAMIN) 27-0.8 MG TABS Take 1 tablet by mouth daily.     Marland Kitchen PROAIR HFA 108 (90 BASE) MCG/ACT inhaler INHALE 2 PUFFS INTO THE  LUNGS EVERY 6 (SIX) HOURS AS NEEDED FOR WHEEZING OR SHORTNESS OF BREATH. 8.5 Inhaler 3  . QVAR 80 MCG/ACT inhaler TWO PUFFS INHALED TWICE A DAY TO PREVENT ASTHMA SYMPTOMS. 8.7 g 2  . ranitidine (ZANTAC) 75 MG tablet Take 75 mg by mouth daily as needed for heartburn.     No Known Allergies  ROS: Pertinent items in HPI  OBJECTIVE BP 126/69 mmHg  Pulse 85  Temp(Src) 98.3 F (36.8 C) (Oral)  Resp 18 CONSTITUTIONAL: Well-developed, well-nourished female in no acute distress.  HENT:  Normocephalic, atraumatic, External right and left ear normal. Oropharynx is clear and moist EYES: Conjunctivae and EOM are normal. Pupils are equal, round, and reactive to light. No scleral icterus.  NECK: Normal range of motion, supple, no masses.  Normal thyroid.  SKIN: Skin is warm and dry. No rash noted. Not diaphoretic. No erythema. No pallor. [place]: Alert and oriented to person, place, and time. Normal reflexes, muscle tone coordination. No cranial nerve deficit noted. PSYCHIATRIC: Normal mood and affect. Normal behavior. Normal judgment and thought content. CARDIOVASCULAR: Normal heart rate noted RESPIRATORY: Effort and breath sounds normal, no problems with respiration noted. ABDOMEN: Soft, normal bowel sounds, no distention noted.  No tenderness, rebound or guarding. Large full mass  noted peri-incisionally. MUSCULOSKELETAL: Normal range of motion. No tenderness.  No cyanosis, clubbing, or edema.  2+ distal pulses.  LAB RESULTS  IMAGING Ct Pelvis W Contrast  01/29/2015  CLINICAL DATA:  Cesarean section 01/11/2015 with swelling above the incision for 1 week. Evaluate for cellulitis versus abscess. Initial encounter. EXAM: CT PELVIS WITH CONTRAST TECHNIQUE: Multidetector CT imaging of the pelvis was performed using the standard protocol following the bolus administration of intravenous contrast. CONTRAST:  136mL OMNIPAQUE IOHEXOL 300 MG/ML  SOLN COMPARISON:  None. FINDINGS: Detail is mildly limited by  body habitus. Urinary Tract: The visualized kidneys appear normal. There is no hydronephrosis. No bladder abnormalities are seen. Bowel: No evidence of bowel wall thickening, distention or surrounding inflammatory change. The appendix appears normal. Vascular/Lymphatic: There are no enlarged abdominal or pelvic lymph nodes. No significant vascular findings are present. Reproductive: The uterus is prominent, as expected status post recent C-section. Protruding superiorly from the uterine body is a probable exophytic fibroid measuring up to 6.9 x 4.5 cm transverse. There is no adnexal mass or intraperitoneal fluid collection. No evidence of bladder flap hematoma. Other: There is a large fluid collection within the subcutaneous fat of the low anterior abdominal wall. This measures up to 16.8 x 8.3 x 11.4 cm and measures 23 HU. No dependent high-density or gas is seen within this collection. This collection abuts the anterior abdominal wall musculature, but shows no extension into the peritoneal cavity. No evidence of abdominal wall hernia. Musculoskeletal: No acute or significant osseous findings. IMPRESSION: 1. Large fluid collection within the subcutaneous fat of the low anterior abdominal wall, likely a postoperative hematoma. An abscess cannot be excluded by this examination, and if that remains a clinical concern, percutaneous sampling of this collection should be performed. 2. No evidence of intraperitoneal fluid collection. 3. Suspected exophytic uterine fibroid. Electronically Signed   By: Richardean Sale M.D.   On: 01/29/2015 12:26   [place] [facility] Ob Follow Up  01/08/2015  OBSTETRICAL ULTRASOUND: This exam was performed within a [facility] [facility] Department. The OB US report was generated in the AS system, and faxed to the ordering physician.  This report is available in the [facility]. See the [facility] Obstetric US report via the Image Link.   MAU COURSE Area cleaned with alcohol swab and injected with 2 cc  1% Lidocaine. Large bore (18g) needle inserted and 750 cc of serous/bloody fluid drained over 20 minutes.  ASSESSMENT 1. Post-operative complication   s/p drainage of large seroma 2.      Intertringonal yeast  PLAN Discharge home Place abdominal binder Treat yeast     Medication List    TAKE these medications        ibuprofen 600 MG tablet  Commonly known as:  ADVIL,MOTRIN  Take 1 tablet (600 mg total) by mouth every 6 (six) hours.     oxyCODONE-acetaminophen 5-325 MG tablet  Commonly known as:  PERCOCET/ROXICET  Take 1-2 tablets by mouth every 6 (six) hours as needed.     Prenatal Vitamin 27-0.8 MG Tabs  Take 1 tablet by mouth daily.     PROAIR HFA 108 (90 BASE) MCG/ACT inhaler  Generic drug:  albuterol  INHALE 2 PUFFS INTO THE LUNGS EVERY 6 (SIX) HOURS AS NEEDED FOR WHEEZING OR SHORTNESS OF BREATH.     QVAR 80 MCG/ACT inhaler  Generic drug:  beclomethasone  TWO PUFFS INHALED TWICE A DAY TO PREVENT ASTHMA SYMPTOMS.     ranitidine 75 MG tablet  Commonly  known as:  ZANTAC  Take 75 mg by mouth daily as needed for heartburn.     terbinafine 1 % cream  Commonly known as:  LAMISIL AT  Apply 1 application topically 2 (two) times daily.       Follow-up Information    Follow up with [facility] [place] In 2 days.   Specialty:  Obstetrics and Gynecology   Why:  postop check, they will call you with an appointment   Contact information:   [place], [place] [phone]       Donnamae Jude, MD 01/29/2015 2:29 PM

## 2015-01-29 NOTE — Progress Notes (Signed)
Dr. Kennon Rounds @ bedside.  #18 bore needle used to aspirate 750cc serousangious fluid from abd,/incision. Patient tolerated well.  Area cleaned, dressing applied and abd. Binder applied

## 2015-01-31 ENCOUNTER — Encounter: Payer: Self-pay | Admitting: Family

## 2015-01-31 ENCOUNTER — Ambulatory Visit (INDEPENDENT_AMBULATORY_CARE_PROVIDER_SITE_OTHER): Payer: BC Managed Care – PPO | Admitting: Family

## 2015-01-31 VITALS — BP 110/82 | HR 68 | Temp 98.1°F | Wt 271.0 lb

## 2015-01-31 DIAGNOSIS — Z09 Encounter for follow-up examination after completed treatment for conditions other than malignant neoplasm: Secondary | ICD-10-CM

## 2015-01-31 DIAGNOSIS — Z9889 Other specified postprocedural states: Secondary | ICD-10-CM | POA: Diagnosis not present

## 2015-01-31 DIAGNOSIS — O1205 Gestational edema, complicating the puerperium: Secondary | ICD-10-CM | POA: Diagnosis not present

## 2015-01-31 NOTE — Progress Notes (Signed)
History   [id number]   CC:  Post-op check  HPI Carrie Gibson is a 39 y.o. female  G1P1001 here for post-op wound check.  Pt had a csection on 01/11/15.  Seen in MAU on 01/29/15 for wound seroma.  CT scan showed 16.8 x 8.3 x 11.4 cm fluid collection c/w seroma. Approximated 700 cc of fluid was drained.  Pt reports area tender, but less painful than before.    No LMP recorded.  OB History  Gravida Para Term Preterm AB SAB TAB Ectopic Multiple Living  1 1 1       0 1    # Outcome Date GA Lbr Len/2nd Weight Sex Delivery Anes PTL Lv  1 Term 01/11/15 [redacted]w[redacted]d  9 lb 8.7 oz (4.33 kg) M CS-Vac Spinal  Y      Past Medical History  Diagnosis Date  . Asthma   . Environmental allergies     Family History  Problem Relation Age of Onset  . Fibroids Mother   . Mental illness Father   . Fibroids Sister     Social History   Social History  . Marital Status: Married    Spouse Name: N/A  . Number of Children: N/A  . Years of Education: N/A   Social History Main Topics  . Smoking status: Former Research scientist (life sciences)  . Smokeless tobacco: None  . Alcohol Use: No  . Drug Use: No  . Sexual Activity:    Partners: Male   Other Topics Concern  . None   Social History Narrative    No Known Allergies  Current Outpatient Prescriptions on File Prior to Visit  Medication Sig Dispense Refill  . ibuprofen (ADVIL,MOTRIN) 600 MG tablet Take 1 tablet (600 mg total) by mouth every 6 (six) hours. 30 tablet 0  . oxyCODONE-acetaminophen (PERCOCET/ROXICET) 5-325 MG tablet Take 1-2 tablets by mouth every 6 (six) hours as needed. 30 tablet 0  . Prenatal Vit-Fe Fumarate-FA (PRENATAL VITAMIN) 27-0.8 MG TABS Take 1 tablet by mouth daily.     Marland Kitchen PROAIR HFA 108 (90 BASE) MCG/ACT inhaler INHALE 2 PUFFS INTO THE LUNGS EVERY 6 (SIX) HOURS AS NEEDED FOR WHEEZING OR SHORTNESS OF BREATH. 8.5 Inhaler 3  . QVAR 80 MCG/ACT inhaler TWO PUFFS INHALED TWICE A DAY TO PREVENT ASTHMA SYMPTOMS. 8.7 g 2  . ranitidine (ZANTAC) 75 MG tablet  Take 75 mg by mouth daily as needed for heartburn.    . terbinafine (LAMISIL AT) 1 % cream Apply 1 application topically 2 (two) times daily. 30 g 0   No current facility-administered medications on file prior to visit.     Review of Systems  Constitutional: Negative for fever and chills.  Gastrointestinal: Positive for abdominal pain (mild tenderness).  All other systems reviewed and are negative.    Physical Exam   Filed Vitals:   01/31/15 0927  BP: 110/82  Pulse: 68  Temp: 98.1 F (36.7 C)  TempSrc: Oral  Weight: 271 lb (122.925 kg)    Physical Exam  Constitutional: She is oriented to person, place, and time. She appears well-developed and well-nourished.  HENT:  Head: Normocephalic.  Neck: Normal range of motion. Neck supple.  Cardiovascular: Normal rate, regular rhythm and normal heart sounds.   Respiratory: Effort normal and breath sounds normal. No respiratory distress.  GI: Soft. There is no tenderness. There is no guarding.  Incision site healing well; well approximated, no signs of infection; palpable edema 10 cm lower than area marked before prior draining of fluid.  Marland Kitchen  Genitourinary: No bleeding in the vagina.  Musculoskeletal: Normal range of motion. She exhibits no edema.  Neurological: She is alert and oriented to person, place, and time. She has normal reflexes.  Skin: Skin is warm and dry.    MAU Course  Procedures  MDM Consulted with Dr. Kennon Rounds > follow-up in one week with a MD  Assessment and Plan  Post-op Check  Follow-up in one week with MD Reviewed to report of swelling increases   Gwen Pounds, CNM 01/31/2015 12:01 PM

## 2015-02-06 ENCOUNTER — Encounter: Payer: Self-pay | Admitting: Obstetrics & Gynecology

## 2015-02-06 ENCOUNTER — Ambulatory Visit (INDEPENDENT_AMBULATORY_CARE_PROVIDER_SITE_OTHER): Payer: BC Managed Care – PPO | Admitting: Obstetrics & Gynecology

## 2015-02-06 VITALS — BP 118/84 | HR 78 | Temp 97.5°F | Resp 16 | Ht 68.0 in | Wt 269.0 lb

## 2015-02-06 DIAGNOSIS — Z09 Encounter for follow-up examination after completed treatment for conditions other than malignant neoplasm: Secondary | ICD-10-CM

## 2015-02-06 DIAGNOSIS — O902 Hematoma of obstetric wound: Secondary | ICD-10-CM

## 2015-02-06 DIAGNOSIS — O8600 Infection of obstetric surgical wound, unspecified: Secondary | ICD-10-CM

## 2015-02-06 NOTE — Progress Notes (Signed)
CLINIC ENCOUNTER NOTE  History:  39 y.o. G1P1001 here today for follow up after I&D of cesarean wound seroma on 01/29/15. CT scan showed 16.8 x 8.3 x 11.4 cm fluid collection consistent with seroma; approximately 700 ml of fluid was drained. Patient reports less tenderness, taking Ibuprofen as needed. She denies any abnormal vaginal discharge, bleeding, or other concerns.   Past Medical History  Diagnosis Date  . Asthma   . Environmental allergies     Past Surgical History  Procedure Laterality Date  . Foot surgery  1995  . Cesarean section N/A 01/11/2015    Procedure: CESAREAN SECTION;  Surgeon: Emily Filbert, MD;  Location: [facility];  Service: Obstetrics;  Laterality: N/A;    The following portions of the patient's history were reviewed and updated as appropriate: allergies, current medications, past family history, past medical history, past social history, past surgical history and problem list.   Health Maintenance:  Normal pap and negative HRHPV on 06/07/14.  Review of Systems:  Pertinent items noted in HPI and remainder of comprehensive ROS otherwise negative.  Objective:  Physical Exam BP 118/84 mmHg  Pulse 78  Temp(Src) 97.5 F (36.4 C) (Oral)  Resp 16  Ht 5\' 8"  (1.727 m)  Wt 269 lb (122.018 kg)  BMI 40.91 kg/m2  Breastfeeding? Yes CONSTITUTIONAL: Well-developed, well-nourished female in no acute distress.  HENT:  Normocephalic, atraumatic. External right and left ear normal. Oropharynx is clear and moist EYES: Conjunctivae and EOM are normal. Pupils are equal, round, and reactive to light. No scleral icterus.  NECK: Normal range of motion, supple, no masses SKIN: Skin is warm and dry. No rash noted. Not diaphoretic. No erythema. No pallor. [place]: Alert and oriented to person, place, and time. Normal reflexes, muscle tone coordination. No cranial nerve deficit noted. PSYCHIATRIC: Normal mood and affect. Normal behavior. Normal judgment and thought  content. CARDIOVASCULAR: Normal heart rate noted RESPIRATORY: Effort and breath sounds normal, no problems with respiration noted ABDOMEN: Soft, obese, no distention noted. Seroma area is decreased, measures about 7 x 8 cm with some resolving induration, no erythema. Mildly tender to touch. No drainage noted. Incision is clean. No open parts of incision.  PELVIC: Deferred MUSCULOSKELETAL: Normal range of motion. No edema noted.  Labs and Imaging 01/29/2015  CLINICAL DATA:  Cesarean section 01/11/2015 with swelling above the incision for 1 week. Evaluate for cellulitis versus abscess. Initial encounter. EXAM: CT PELVIS WITH CONTRAST TECHNIQUE: Multidetector CT imaging of the pelvis was performed using the standard protocol following the bolus administration of intravenous contrast. CONTRAST:  151mL OMNIPAQUE IOHEXOL 300 MG/ML  SOLN COMPARISON:  None. FINDINGS: Detail is mildly limited by body habitus. Urinary Tract: The visualized kidneys appear normal. There is no hydronephrosis. No bladder abnormalities are seen. Bowel: No evidence of bowel wall thickening, distention or surrounding inflammatory change. The appendix appears normal. Vascular/Lymphatic: There are no enlarged abdominal or pelvic lymph nodes. No significant vascular findings are present. Reproductive: The uterus is prominent, as expected status post recent C-section. Protruding superiorly from the uterine body is a probable exophytic fibroid measuring up to 6.9 x 4.5 cm transverse. There is no adnexal mass or intraperitoneal fluid collection. No evidence of bladder flap hematoma. Other: There is a large fluid collection within the subcutaneous fat of the low anterior abdominal wall. This measures up to 16.8 x 8.3 x 11.4 cm and measures 23 HU. No dependent high-density or gas is seen within this collection. This collection abuts the anterior abdominal wall  musculature, but shows no extension into the peritoneal cavity. No evidence of  abdominal wall hernia. Musculoskeletal: No acute or significant osseous findings. IMPRESSION: 1. Large fluid collection within the subcutaneous fat of the low anterior abdominal wall, likely a postoperative hematoma. An abscess cannot be excluded by this examination, and if that remains a clinical concern, percutaneous sampling of this collection should be performed. 2. No evidence of intraperitoneal fluid collection. 3. Suspected exophytic uterine fibroid. Electronically Signed   By: Richardean Sale M.D.   On: 01/29/2015 12:26    Assessment & Plan:  Encounter for recheck of cesarean wound seroma following incision and drainage on 01/29/15 Resolving seroma, no signs of infection Continue analgesia as needed Continue abdominal binder Follow up in 2 weeks for postpartum visit  Total face-to-face time with patient: 15 minutes. Over 50% of encounter was spent on counseling and coordination of care.   Verita Schneiders, MD, [place] Attending Obstetrician & Gynecologist, [place] for [facility]

## 2015-02-25 ENCOUNTER — Encounter: Payer: Self-pay | Admitting: Obstetrics & Gynecology

## 2015-02-25 ENCOUNTER — Encounter: Payer: Self-pay | Admitting: *Deleted

## 2015-02-25 ENCOUNTER — Ambulatory Visit (INDEPENDENT_AMBULATORY_CARE_PROVIDER_SITE_OTHER): Payer: BC Managed Care – PPO | Admitting: Obstetrics & Gynecology

## 2015-02-25 DIAGNOSIS — O902 Hematoma of obstetric wound: Secondary | ICD-10-CM

## 2015-02-25 NOTE — Progress Notes (Signed)
Patient ID: Carrie Gibson, female   DOB: 12/09/1975, 39 y.o.   MRN: [id number] Post Partum Exam  Carrie Gibson is a 39 y.o. G43P1001 female who presents for a postpartum visit. She is 6 weeks postpartum following a low cervical vertical Cesarean section. I have fully reviewed the prenatal and intrapartum course. The delivery was at 46 gestational weeks.  Anesthesia: spinal. Postpartum course has been complicated by an incisional seroma. Baby's course has been unremarkable. Baby is feeding by breast. Bleeding no bleeding. Bowel function is normal. Bladder function is normal. Patient is not sexually active. Contraception method is none. Her husband is infertile. (His brother donated sperm for this pregnancy)  Postpartum depression screening: negative.  The following portions of the patient's history were reviewed and updated as appropriate: allergies, current medications, past family history, past medical history, past social history, past surgical history and problem list.  Review of Systems Pertinent items noted in HPI and remainder of comprehensive ROS otherwise negative.   Objective:    BP 116/78 mmHg  Pulse 78  Resp 16  Ht 5\' 5"  (1.651 m)  Wt 211 lb (95.709 kg)  BMI 35.11 kg/m2  Breastfeeding? Yes  General:  alert   Breasts:  inspection negative, no nipple discharge or bleeding, no masses or nodularity palpable  Lungs: clear to auscultation bilaterally  Heart:  regular rate and rhythm, S1, S2 normal, no murmur, click, rub or gallop  Abdomen: soft, non-tender; bowel sounds normal; no masses,  no organomegaly  Incision: healed well. Minimal tenderness with palpation of her panus   Vulva:  not evaluated  Vagina: not evaluated  Cervix:  not evaluated  Corpus: not examined  Adnexa:  not evaluated  Rectal Exam: Not performed.        Assessment:    Normal postpartum exam. Pap smear not done at today's visit.   Plan:    1. Contraception: none 2. Seroma- Reassurance that her soreness will  resolve She will stay out of work for another 2 weeks  332

## 2015-03-05 ENCOUNTER — Other Ambulatory Visit: Payer: Self-pay | Admitting: Family Medicine

## 2015-05-17 ENCOUNTER — Other Ambulatory Visit: Payer: Self-pay | Admitting: Family Medicine

## 2015-05-21 ENCOUNTER — Other Ambulatory Visit: Payer: Self-pay | Admitting: Family Medicine

## 2015-05-24 ENCOUNTER — Encounter: Payer: Self-pay | Admitting: Family Medicine

## 2015-06-05 ENCOUNTER — Encounter: Payer: Self-pay | Admitting: Family Medicine

## 2015-07-13 ENCOUNTER — Other Ambulatory Visit: Payer: Self-pay | Admitting: Family Medicine

## 2015-07-14 MED ORDER — ALBUTEROL SULFATE HFA 108 (90 BASE) MCG/ACT IN AERS: INHALATION_SPRAY | RESPIRATORY_TRACT | Status: DC

## 2015-11-01 ENCOUNTER — Other Ambulatory Visit: Payer: Self-pay | Admitting: Family Medicine

## 2015-11-23 IMAGING — CT CT PELVIS W/ CM
3 of 5 series · 13 of 32 positions shown, 18 images · IV contrast (omnipaque)
Comparison: None.

CLINICAL DATA: Cesarean section 01/11/2015 with swelling above the
incision for 1 week. Evaluate for cellulitis versus abscess. Initial
encounter.

EXAM:
CT PELVIS WITH CONTRAST
TECHNIQUE: Multidetector CT imaging of the pelvis was performed using the
standard protocol following the bolus administration of intravenous
contrast.
CONTRAST:  100mL OMNIPAQUE IOHEXOL 300 MG/ML  SOLN

[Series 300: sag 1 · sagittal · 0.98mm/px · 6 of 230 slices shown, 11 images]
[im 33/230  soft-tissue]
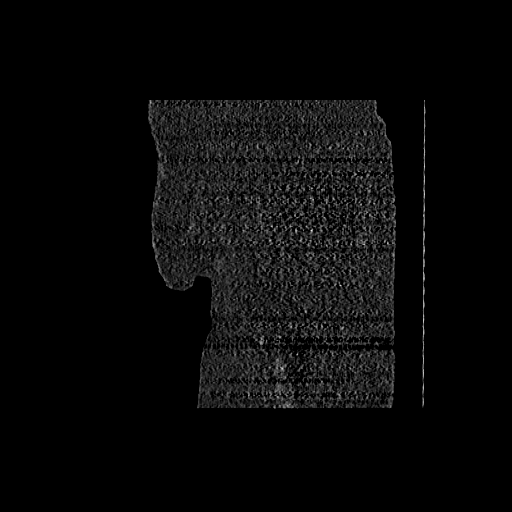
[im 33/230  lung]
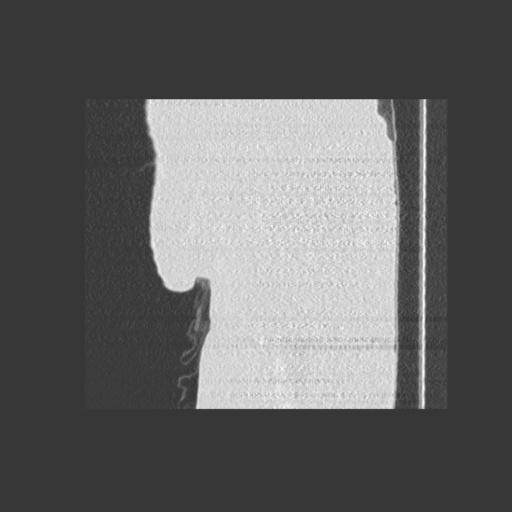
[im 33/230  bone]
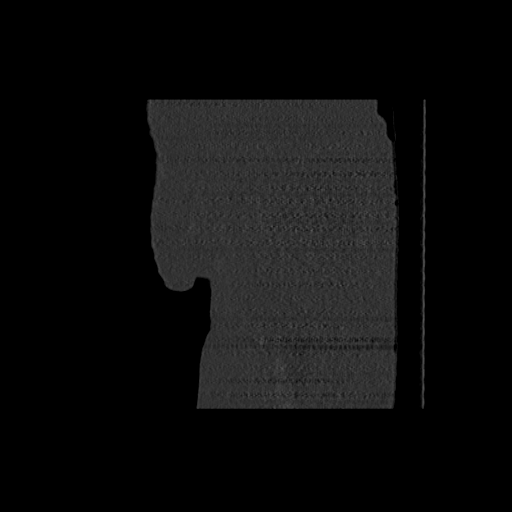
[im 66/230  soft-tissue]
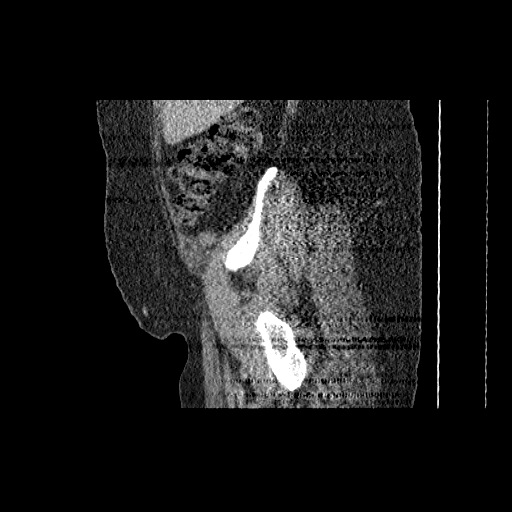
[im 66/230  lung]
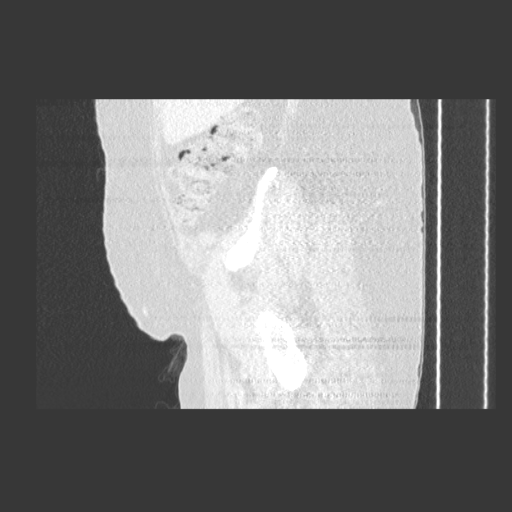
[im 99/230  soft-tissue]
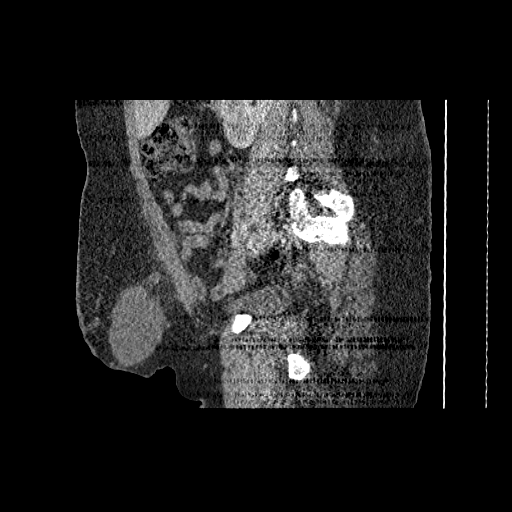
[im 99/230  lung]
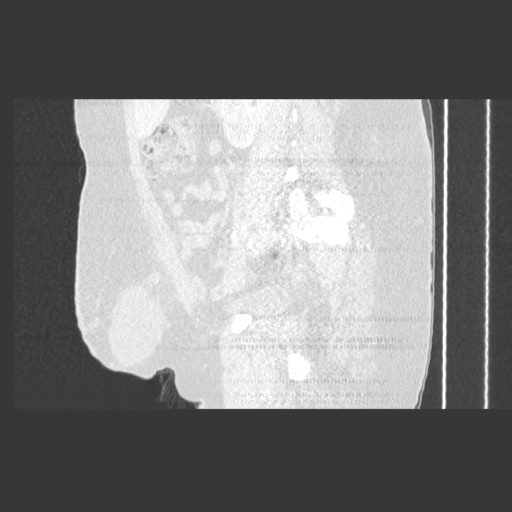
[im 131/230  soft-tissue]
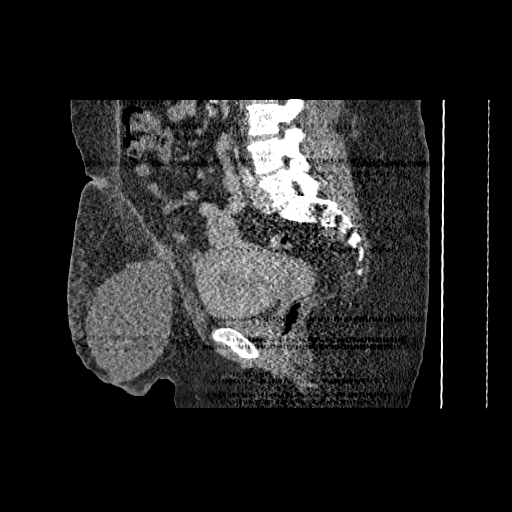
[im 131/230  lung]
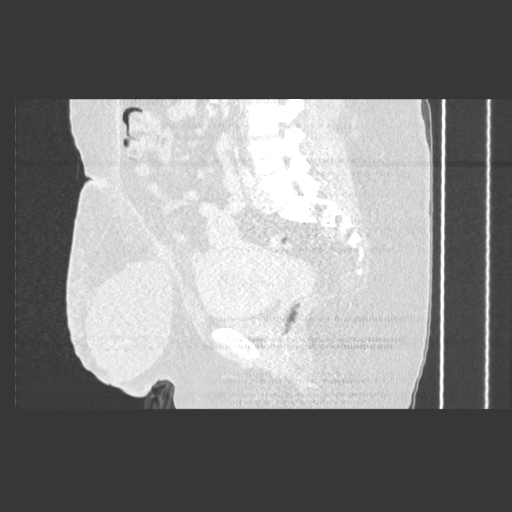
[im 164/230  soft-tissue]
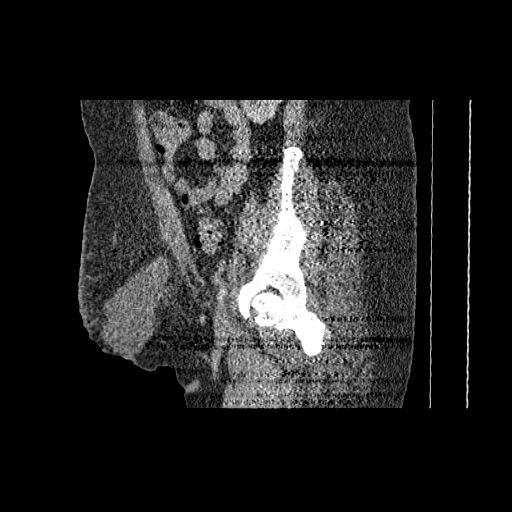
[im 197/230  soft-tissue]
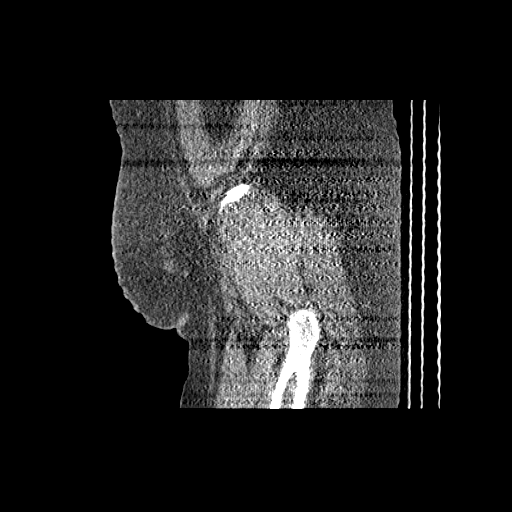

[Series 301: sag 2 · sagittal · 0.98mm/px · 6 of 230 slices shown]
[im 33/230  soft-tissue]
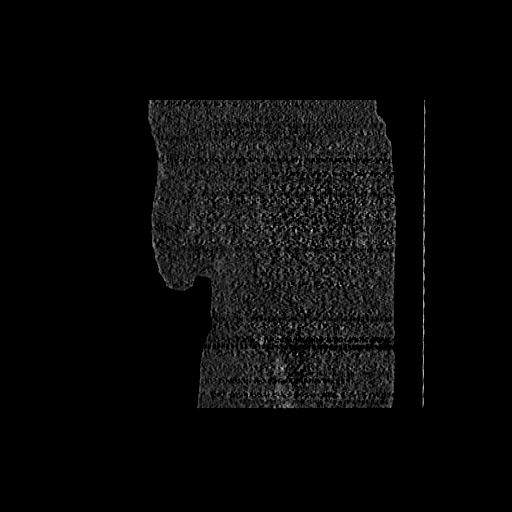
[im 66/230  soft-tissue]
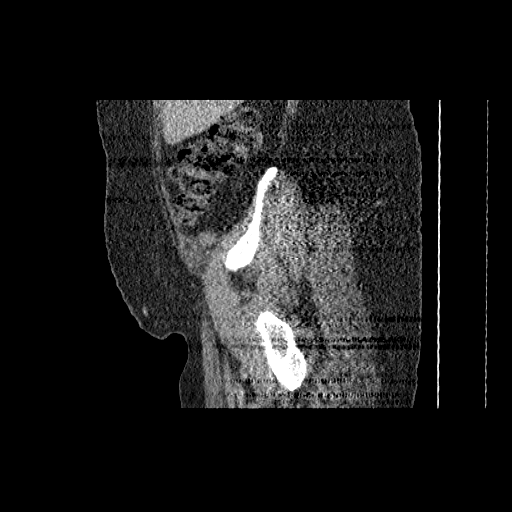
[im 99/230  soft-tissue]
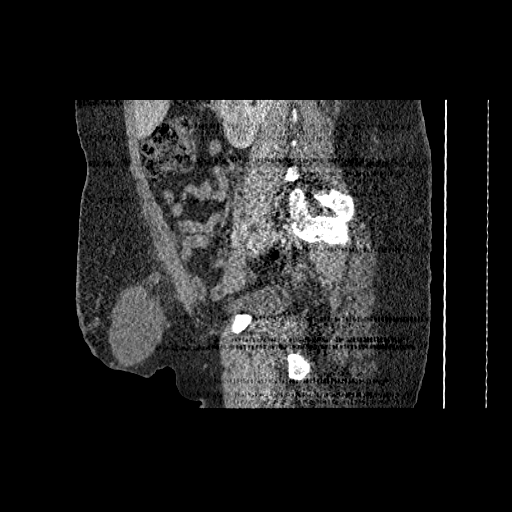
[im 131/230  soft-tissue]
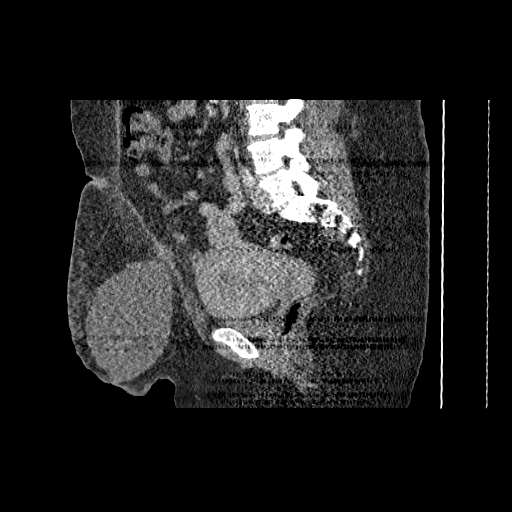
[im 164/230  soft-tissue]
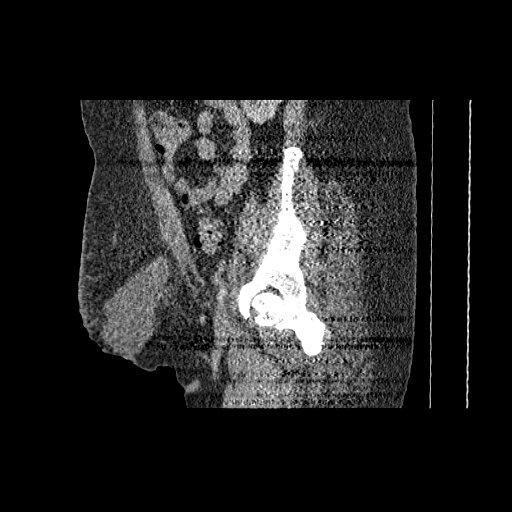
[im 197/230  soft-tissue]
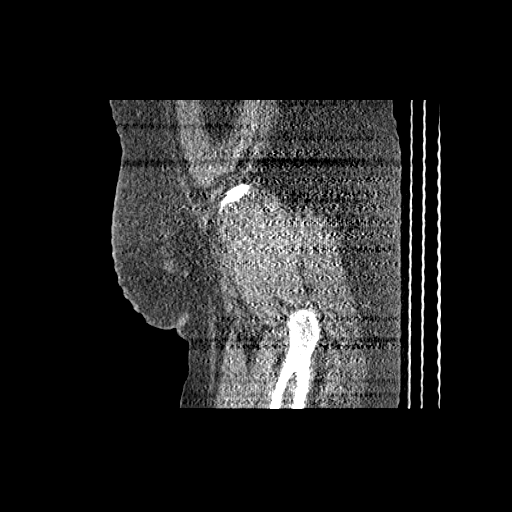

[Series 303: cor 1 · coronal · 0.98mm/px · 1 of 203 slices shown]
[im 34/203  soft-tissue]
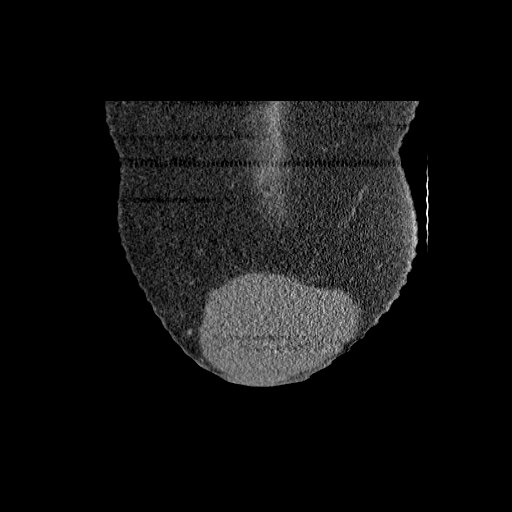

[13 of 32 positions shown; findings below may reference images not displayed]

FINDINGS: Detail is mildly limited by body habitus.

Urinary Tract: The visualized kidneys appear normal. There is no
hydronephrosis. No bladder abnormalities are seen.

Bowel: No evidence of bowel wall thickening, distention or
surrounding inflammatory change. The appendix appears normal.

Vascular/Lymphatic: There are no enlarged abdominal or pelvic lymph
nodes. No significant vascular findings are present.

Reproductive: The uterus is prominent, as expected status post
recent C-section. Protruding superiorly from the uterine body is a
probable exophytic fibroid measuring up to 6.9 x 4.5 cm transverse.
There is no adnexal mass or intraperitoneal fluid collection. No
evidence of bladder flap hematoma.

Other: There is a large fluid collection within the subcutaneous fat
of the low anterior abdominal wall. This measures up to 16.8 x 8.3 x
11.4 cm and measures 23 HU. No dependent high-density or gas is seen
within this collection. This collection abuts the anterior abdominal
wall musculature, but shows no extension into the peritoneal cavity.
No evidence of abdominal wall hernia.

Musculoskeletal: No acute or significant osseous findings.
IMPRESSION: 1. Large fluid collection within the subcutaneous fat of the low
anterior abdominal wall, likely a postoperative hematoma. An abscess
cannot be excluded by this examination, and if that remains a
clinical concern, percutaneous sampling of this collection should be
performed.
2. No evidence of intraperitoneal fluid collection.
3. Suspected exophytic uterine fibroid.

## 2016-01-06 ENCOUNTER — Other Ambulatory Visit: Payer: Self-pay | Admitting: Family Medicine

## 2016-02-09 ENCOUNTER — Other Ambulatory Visit: Payer: Self-pay | Admitting: Family Medicine

## 2016-02-18 ENCOUNTER — Encounter: Payer: Self-pay | Admitting: Family Medicine

## 2016-02-18 ENCOUNTER — Ambulatory Visit (INDEPENDENT_AMBULATORY_CARE_PROVIDER_SITE_OTHER): Payer: BC Managed Care – PPO | Admitting: Family Medicine

## 2016-02-18 VITALS — BP 118/88 | HR 80 | Wt 287.0 lb

## 2016-02-18 DIAGNOSIS — Z23 Encounter for immunization: Secondary | ICD-10-CM | POA: Diagnosis not present

## 2016-02-18 DIAGNOSIS — S91209A Unspecified open wound of unspecified toe(s) with damage to nail, initial encounter: Secondary | ICD-10-CM

## 2016-02-18 DIAGNOSIS — J4540 Moderate persistent asthma, uncomplicated: Secondary | ICD-10-CM

## 2016-02-18 DIAGNOSIS — K219 Gastro-esophageal reflux disease without esophagitis: Secondary | ICD-10-CM | POA: Diagnosis not present

## 2016-02-18 MED ORDER — FLUTICASONE PROPIONATE 50 MCG/ACT NA SUSP: 2.0000 | Freq: Every day | NASAL | 2 refills | Status: DC

## 2016-02-18 MED ORDER — ALBUTEROL SULFATE HFA 108 (90 BASE) MCG/ACT IN AERS: INHALATION_SPRAY | RESPIRATORY_TRACT | 2 refills | Status: DC

## 2016-02-18 MED ORDER — BECLOMETHASONE DIPROPIONATE 80 MCG/ACT IN AERS: INHALATION_SPRAY | RESPIRATORY_TRACT | 6 refills | Status: DC

## 2016-02-18 MED ORDER — OMEPRAZOLE 40 MG PO CPDR: 40.0000 mg | DELAYED_RELEASE_CAPSULE | Freq: Every day | ORAL | 3 refills | Status: DC

## 2016-02-18 NOTE — Progress Notes (Signed)
Carrie Gibson is a 40 y.o. female who presents to [place] today for asthma and allergies, right great toenail injury, and morbid obesity.  Asthma and allergies: Present ongoing for some time. Patient has run out of her albuterol and Qvar. She also is not using steroid nasal spray. She notes that over-the-counter cetirizine does not work very well. She notes wheezing cough congestion and runny nose for the last few weeks. She denies any fevers or chills.  Toenail injury: A while ago patient suffered a great toenail avulsion. The toenail fell off and regrew. She reinjured the toenail a few weeks ago and notes that the toenail appears to be elevated loose and painful. It is no longer bleeding.  Morbid obesity: Patient always has bowel with her weight however she notes she's had trouble losing weight recently. She's tried many different weight loss strategies in the past. She is interested in seeing a dietitian.  Acid reflux: Patient is daily acid reflux that is not well controlled with ranitidine.   Past Medical History:  Diagnosis Date  . Asthma   . Environmental allergies   . Morbid obesity ([place])    Past Surgical History:  Procedure Laterality Date  . CESAREAN SECTION N/A 01/11/2015   Procedure: CESAREAN SECTION;  Surgeon: Emily Filbert, MD;  Location: [facility];  Service: Obstetrics;  Laterality: N/A;  . FOOT SURGERY  1995   Social History  Substance Use Topics  . Smoking status: Former Research scientist (life sciences)  . Smokeless tobacco: Not on file  . Alcohol use No   family history includes Fibroids in her mother and sister; Mental illness in her father.  ROS as above:  Medications: Current Outpatient Prescriptions  Medication Sig Dispense Refill  . albuterol (PROAIR HFA) 108 (90 Base) MCG/ACT inhaler INHALE 2 PUFFS INTO THE LUNGS EVERY 6 (SIX) HOURS AS NEEDED FOR WHEEZING OR  SHORTNESS OF BREATH. 8.5 Inhaler 2  . beclomethasone (QVAR) 80 MCG/ACT inhaler TWO PUFFS INHALED TWICE A DAY TO PREVENT ASTHMA SYMPTOMS. 8.7 g 6  . ibuprofen (ADVIL,MOTRIN) 600 MG tablet Take 1 tablet (600 mg total) by mouth every 6 (six) hours. 30 tablet 0  . Prenatal Vit-Fe Fumarate-FA (PRENATAL VITAMIN) 27-0.8 MG TABS Take 1 tablet by mouth daily.     Marland Kitchen terbinafine (LAMISIL AT) 1 % cream Apply 1 application topically 2 (two) times daily. 30 g 0  . fluticasone (FLONASE) 50 MCG/ACT nasal spray Place 2 sprays into both nostrils daily. 16 g 2  . omeprazole (PRILOSEC) 40 MG capsule Take 1 capsule (40 mg total) by mouth daily. 90 capsule 3   No current facility-administered medications for this visit.    No Known Allergies  Health Maintenance Health Maintenance  Topic Date Due  . PAP SMEAR  06/07/2017  . TETANUS/TDAP  10/27/2024  . INFLUENZA VACCINE  Completed  . HIV Screening  Completed     Exam:  BP 118/88   Pulse 80   Wt 287 lb (130.2 kg)   BMI 46.32 kg/m  Gen: Well NAD Obese HEENT: EOMI,  MMM Lungs: Normal work of breathing. CTABL Heart: RRR no MRG Abd: NABS, Soft. Nondistended, Nontender Exts: Brisk capillary refill, warm and well perfused.  Right great toenail: Loose appearing easily mobile. The nail can be elevated almost completely to the base without significant pain. No bleeding.  Toenail trimmed: The loose part of the toenail was trimmed with scissors until a more stable portion was reached.  Patient said it felt better. No bleeding.  No results found for this or any previous visit (from the past 72 hour(s)). No results found.    Assessment and Plan: 40 y.o. female with  Asthma and allergies: Not well-controlled. Refilled Qvar albuterol and use Flonase nasal spray and cetirizine. Recheck if not better.  Acid reflux: Discontinue ranitidine and start omeprazole.  Toenail avulsion: Trimmed as above. Refer to podiatry for definitive management.  Morbid obesity:  Ongoing. Refer to registered dietitian.  Obtain fasting labs listed below. Influenza vaccine given. Orders Placed This Encounter  Procedures  . Flu Vaccine QUAD 36+ mos IM  . CBC  . COMPLETE METABOLIC PANEL WITH GFR  . Hemoglobin A1c  . Lipid panel  . VITAMIN D 25 Hydroxy (Vit-D Deficiency, Fractures)  . TSH  . Amb ref to Medical Nutrition Therapy-MNT    Referral Priority:   Routine    Referral Type:   Consultation    Referral Reason:   Specialty Services Required    Requested Specialty:   Nutrition    Number of Visits Requested:   1  . Ambulatory referral to Podiatry    Referral Priority:   Routine    Referral Type:   Consultation    Referral Reason:   Specialty Services Required    Requested Specialty:   Podiatry    Number of Visits Requested:   1    Discussed warning signs or symptoms. Please see discharge instructions. Patient expresses understanding.

## 2016-02-18 NOTE — Patient Instructions (Signed)
Thank you for coming in today. Get fasting labs soon.  Restart asthma and allergy medicine.  STOP zantac.  Start omeprazole.  You should hear from dietician.  You should also hear from Podiatry.   Recheck in 1-2 months.

## 2016-02-23 ENCOUNTER — Other Ambulatory Visit: Payer: Self-pay

## 2016-02-23 MED ORDER — BECLOMETHASONE DIPROPIONATE 80 MCG/ACT IN AERS: INHALATION_SPRAY | RESPIRATORY_TRACT | 1 refills | Status: DC

## 2016-03-03 LAB — CBC
HCT: 41.5 % (ref 35.0–45.0)
HEMOGLOBIN: 13.8 g/dL (ref 11.7–15.5)
MCH: 27 pg (ref 27.0–33.0)
MCHC: 33.3 g/dL (ref 32.0–36.0)
MCV: 81.1 fL (ref 80.0–100.0)
MPV: 9.6 fL (ref 7.5–12.5)
Platelets: 365 10*3/uL (ref 140–400)
RBC: 5.12 MIL/uL — ABNORMAL HIGH (ref 3.80–5.10)
RDW: 14 % (ref 11.0–15.0)
WBC: 5.9 10*3/uL (ref 3.8–10.8)

## 2016-03-03 LAB — HEMOGLOBIN A1C
HEMOGLOBIN A1C: 4.8 % (ref <5.7)
Mean Plasma Glucose: 91 mg/dL

## 2016-03-04 LAB — COMPLETE METABOLIC PANEL WITH GFR
ALBUMIN: 4.2 g/dL (ref 3.6–5.1)
ALK PHOS: 79 U/L (ref 33–115)
ALT: 20 U/L (ref 6–29)
AST: 21 U/L (ref 10–30)
BUN: 9 mg/dL (ref 7–25)
CO2: 23 mmol/L (ref 20–31)
Calcium: 9 mg/dL (ref 8.6–10.2)
Chloride: 108 mmol/L (ref 98–110)
Creat: 0.73 mg/dL (ref 0.50–1.10)
GFR, Est African American: >89 mL/min (ref >=60)
GLUCOSE: 87 mg/dL (ref 65–99)
POTASSIUM: 4.3 mmol/L (ref 3.5–5.3)
SODIUM: 141 mmol/L (ref 135–146)
Total Bilirubin: 0.6 mg/dL (ref 0.2–1.2)
Total Protein: 7.3 g/dL (ref 6.1–8.1)

## 2016-03-04 LAB — TSH: TSH: 1.49 mIU/L

## 2016-03-04 LAB — LIPID PANEL
CHOLESTEROL: 185 mg/dL (ref <200)
HDL: 31 mg/dL — ABNORMAL LOW (ref >50)
LDL Cholesterol: 130 mg/dL — ABNORMAL HIGH (ref <100)
TRIGLYCERIDES: 120 mg/dL (ref <150)
Total CHOL/HDL Ratio: 6 Ratio — ABNORMAL HIGH (ref <5.0)
VLDL: 24 mg/dL (ref <30)

## 2016-03-04 LAB — VITAMIN D 25 HYDROXY (VIT D DEFICIENCY, FRACTURES): Vit D, 25-Hydroxy: 22 ng/mL — ABNORMAL LOW (ref 30–100)

## 2016-04-15 ENCOUNTER — Other Ambulatory Visit: Payer: Self-pay | Admitting: *Deleted

## 2016-04-15 MED ORDER — FLUTICASONE PROPIONATE 50 MCG/ACT NA SUSP: 2.0000 | Freq: Every day | NASAL | 2 refills | Status: DC

## 2016-04-22 ENCOUNTER — Ambulatory Visit (INDEPENDENT_AMBULATORY_CARE_PROVIDER_SITE_OTHER): Payer: BC Managed Care – PPO | Admitting: Family Medicine

## 2016-04-22 ENCOUNTER — Encounter: Payer: Self-pay | Admitting: Family Medicine

## 2016-04-22 DIAGNOSIS — G43009 Migraine without aura, not intractable, without status migrainosus: Secondary | ICD-10-CM | POA: Diagnosis not present

## 2016-04-22 DIAGNOSIS — G43909 Migraine, unspecified, not intractable, without status migrainosus: Secondary | ICD-10-CM | POA: Insufficient documentation

## 2016-04-22 DIAGNOSIS — R519 Headache, unspecified: Secondary | ICD-10-CM | POA: Insufficient documentation

## 2016-04-22 DIAGNOSIS — R51 Headache: Secondary | ICD-10-CM

## 2016-04-22 MED ORDER — FLUTICASONE PROPIONATE 50 MCG/ACT NA SUSP: 2.0000 | Freq: Every day | NASAL | 12 refills | Status: DC

## 2016-04-22 MED ORDER — BECLOMETHASONE DIPROPIONATE 80 MCG/ACT IN AERS: INHALATION_SPRAY | RESPIRATORY_TRACT | 12 refills | Status: DC

## 2016-04-22 MED ORDER — SUMATRIPTAN SUCCINATE 50 MG PO TABS: 50.0000 mg | ORAL_TABLET | ORAL | 2 refills | Status: DC | PRN

## 2016-04-22 NOTE — Patient Instructions (Addendum)
Thank you for coming in today. You are doing well with diet and weight loss.  Take imitrex and Excedrin at the onset of headache.  Let me know if you are worsening.  Continue the diet and weight loss.  Pay attention to calories.   Continue other medicines.   Recheck in 6-12 months or sooner if needed.

## 2016-04-23 NOTE — Progress Notes (Signed)
Carrie Gibson is a 41 y.o. female who presents to [place]: [place] today for headache, obesity and asthma.    Headache: Lanny notes intermittent headaches occurring more frequently over the last month. She has a pounding bitemporal headache. She denies any visual aura. She notes she has been diagnosed with migraines in the past and thinks these may be migraines. She denies any weakness or numbness or loss of function. She takes Excedrin migraine as needed which seems to help.  Asthma: Patient is breathing much better now that she is using Qvar twice daily. No chest pain palpitations or severe cough.  Morbid obesity: Patient has lost weight after seeing a dietitian. She feels as though it is currently sustainable.  Past Medical History:  Diagnosis Date  . Asthma   . Environmental allergies   . Morbid obesity ([place])    Past Surgical History:  Procedure Laterality Date  . CESAREAN SECTION N/A 01/11/2015   Procedure: CESAREAN SECTION;  Surgeon: Emily Filbert, MD;  Location: [facility];  Service: Obstetrics;  Laterality: N/A;  . FOOT SURGERY  1995   Social History  Substance Use Topics  . Smoking status: Former Research scientist (life sciences)  . Smokeless tobacco: Not on file  . Alcohol use No   family history includes Fibroids in her mother and sister; Mental illness in her father.  ROS as above:  Medications: Current Outpatient Prescriptions  Medication Sig Dispense Refill  . albuterol (PROAIR HFA) 108 (90 Base) MCG/ACT inhaler INHALE 2 PUFFS INTO THE LUNGS EVERY 6 (SIX) HOURS AS NEEDED FOR WHEEZING OR SHORTNESS OF BREATH. 8.5 Inhaler 2  . beclomethasone (QVAR) 80 MCG/ACT inhaler TWO PUFFS INHALED TWICE A DAY TO PREVENT ASTHMA SYMPTOMS. 26.1 g 12  . fluticasone (FLONASE) 50 MCG/ACT nasal spray Place 2 sprays into both nostrils daily. 16 g 12  . ibuprofen (ADVIL,MOTRIN) 600 MG tablet Take 1 tablet (600  mg total) by mouth every 6 (six) hours. 30 tablet 0  . omeprazole (PRILOSEC) 40 MG capsule Take 1 capsule (40 mg total) by mouth daily. 90 capsule 3  . Prenatal Vit-Fe Fumarate-FA (PRENATAL VITAMIN) 27-0.8 MG TABS Take 1 tablet by mouth daily.     Marland Kitchen terbinafine (LAMISIL AT) 1 % cream Apply 1 application topically 2 (two) times daily. 30 g 0  . SUMAtriptan (IMITREX) 50 MG tablet Take 1 tablet (50 mg total) by mouth every 2 (two) hours as needed for migraine. May repeat in 2 hours if headache persists or recurs. 10 tablet 2   No current facility-administered medications for this visit.    No Known Allergies  Health Maintenance Health Maintenance  Topic Date Due  . PAP SMEAR  06/07/2017  . TETANUS/TDAP  10/27/2024  . INFLUENZA VACCINE  Completed  . HIV Screening  Completed     Exam:  BP 126/84   Pulse 71   Wt 270 lb (122.5 kg)   BMI 43.58 kg/m  Gen: Well NAD HEENT: EOMI,  MMM Clear nasal discharge Lungs: Normal work of breathing. CTABL Heart: RRR no MRG Abd: NABS, Soft. Nondistended, Nontender Exts: Brisk capillary refill, warm and well perfused. Neuro: Alert and oriented normal balance and coordination and gait.    No results found for this or any previous visit (from the past 72 hour(s)). No results found.    Assessment and Plan: 41 y.o. female with  Headache: Likely migraine type. Plan to use sumatriptan and and NSAIDs as needed.  Asthma: Doing  well. Continue Qvar and intermittent albuterol as needed.    obesity: Improved. Patient has lost 17 pounds. Continue dietary restrictions.  No orders of the defined types were placed in this encounter.   Discussed warning signs or symptoms. Please see discharge instructions. Patient expresses understanding.

## 2016-06-19 ENCOUNTER — Encounter: Payer: Self-pay | Admitting: Family Medicine

## 2016-06-23 MED ORDER — SUMATRIPTAN SUCCINATE 50 MG PO TABS: 50.0000 mg | ORAL_TABLET | ORAL | 2 refills | Status: DC | PRN

## 2016-07-14 ENCOUNTER — Ambulatory Visit (INDEPENDENT_AMBULATORY_CARE_PROVIDER_SITE_OTHER): Payer: BC Managed Care – PPO | Admitting: Family Medicine

## 2016-07-14 DIAGNOSIS — R51 Headache: Secondary | ICD-10-CM | POA: Diagnosis not present

## 2016-07-14 DIAGNOSIS — G8929 Other chronic pain: Secondary | ICD-10-CM

## 2016-07-14 MED ORDER — TOPIRAMATE 50 MG PO TABS: ORAL_TABLET | ORAL | 0 refills | Status: DC

## 2016-07-14 NOTE — Patient Instructions (Signed)
Thank you for coming in today. I recommend starting Topamax for headaches.  This will help reduce headaches and hopefully make them less common.  Start with a 1/2 pill a day and increase to 1 pill daily.  Recheck in 1 month.  We will also get the ball rolling to get into a headache clinic.  Recheck sooner if needed.    Migraine Headache A migraine headache is a very strong throbbing pain on one side or both sides of your head. Migraines can also cause other symptoms. Talk with your doctor about what things may bring on (trigger) your migraine headaches. Follow these instructions at home: Medicines   Take over-the-counter and prescription medicines only as told by your doctor.  Do not drive or use heavy machinery while taking prescription pain medicine.  To prevent or treat constipation while you are taking prescription pain medicine, your doctor may recommend that you:  Drink enough fluid to keep your pee (urine) clear or pale yellow.  Take over-the-counter or prescription medicines.  Eat foods that are high in fiber. These include fresh fruits and vegetables, whole grains, and beans.  Limit foods that are high in fat and processed sugars. These include fried and sweet foods. Lifestyle   Avoid alcohol.  Do not use any products that contain nicotine or tobacco, such as cigarettes and e-cigarettes. If you need help quitting, ask your doctor.  Get at least 8 hours of sleep every night.  Limit your stress. General instructions    Keep a journal to find out what may bring on your migraines. For example, write down:  What you eat and drink.  How much sleep you get.  Any change in what you eat or drink.  Any change in your medicines.  If you have a migraine:  Avoid things that make your symptoms worse, such as bright lights.  It may help to lie down in a dark, quiet room.  Do not drive or use heavy machinery.  Ask your doctor what activities are safe for you.  Keep  all follow-up visits as told by your doctor. This is important. Contact a doctor if:  You get a migraine that is different or worse than your usual migraines. Get help right away if:  Your migraine gets very bad.  You have a fever.  You have a stiff neck.  You have trouble seeing.  Your muscles feel weak or like you cannot control them.  You start to lose your balance a lot.  You start to have trouble walking.  You pass out (faint). This information is not intended to replace advice given to you by your health care provider. Make sure you discuss any questions you have with your health care provider. Document Released: 01/06/2008 Document Revised: 10/17/2015 Document Reviewed: 09/15/2015 Elsevier Interactive Patient Education  2017 [employer].

## 2016-07-14 NOTE — Progress Notes (Signed)
Carrie Gibson is a 41 y.o. female who presents to [facility] today for headache. Patient has a history of recurrent bitemporal pounding headaches. These occur approximately 4 times per month. Her triggers are weather changes and also menstrual periods. She has been using sumatriptan and Excedrin to help relieve the headache symptoms but they are only marginally helpful. She notes symptoms are quite bothersome. She denies any weakness or numbness or loss of function. She notes that she has a pertinently positive family history for migraine headaches. Her mother and her sister both suffer from migraines.   Past Medical History:  Diagnosis Date  . Asthma   . Environmental allergies   . Morbid obesity ([place])    Past Surgical History:  Procedure Laterality Date  . CESAREAN SECTION N/A 01/11/2015   Procedure: CESAREAN SECTION;  Surgeon: Emily Filbert, MD;  Location: [facility];  Service: Obstetrics;  Laterality: N/A;  . FOOT SURGERY  1995   Social History  Substance Use Topics  . Smoking status: Former Research scientist (life sciences)  . Smokeless tobacco: Not on file  . Alcohol use No   family history includes Fibroids in her mother and sister; Mental illness in her father.  ROS as above:  Medications: Current Outpatient Prescriptions  Medication Sig Dispense Refill  . albuterol (PROAIR HFA) 108 (90 Base) MCG/ACT inhaler INHALE 2 PUFFS INTO THE LUNGS EVERY 6 (SIX) HOURS AS NEEDED FOR WHEEZING OR SHORTNESS OF BREATH. 8.5 Inhaler 2  . amitriptyline (ELAVIL) 25 MG tablet Take 0.5-1 tablets (12.5-25 mg total) by mouth at bedtime. 30 tablet 1  . beclomethasone (QVAR) 80 MCG/ACT inhaler TWO PUFFS INHALED TWICE A DAY TO PREVENT ASTHMA SYMPTOMS. 26.1 g 12  . fluticasone (FLONASE) 50 MCG/ACT nasal spray Place 2 sprays into both nostrils daily. 16 g 12  . ibuprofen (ADVIL,MOTRIN) 600 MG tablet Take 1 tablet (600  mg total) by mouth every 6 (six) hours. 30 tablet 0  . omeprazole (PRILOSEC) 40 MG capsule Take 1 capsule (40 mg total) by mouth daily. 90 capsule 3  . Prenatal Vit-Fe Fumarate-FA (PRENATAL VITAMIN) 27-0.8 MG TABS Take 1 tablet by mouth daily.     . SUMAtriptan (IMITREX) 50 MG tablet Take 1 tablet (50 mg total) by mouth every 2 (two) hours as needed for migraine. May repeat in 2 hours if headache persists or recurs. 30 tablet 2  . terbinafine (LAMISIL AT) 1 % cream Apply 1 application topically 2 (two) times daily. 30 g 0  . trimethoprim-polymyxin b (POLYTRIM) ophthalmic solution Place 1 drop into the right eye every 4 (four) hours. 10 mL 0   No current facility-administered medications for this visit.    Allergies  Allergen Reactions  . Topamax [Topiramate] Other (See Comments)    Bad paresthesia     Health Maintenance Health Maintenance  Topic Date Due  . INFLUENZA VACCINE  11/10/2016  . PAP SMEAR  06/07/2017  . TETANUS/TDAP  10/27/2024  . HIV Screening  Completed     Exam:   Gen: Well NAD Neuro: Alert and oriented normal coordination balance and gait. No motor defects. Normal sensation throughout. Neck exam: Normal C-spine motion   No results found for this or any previous visit (from the past 72 hour(s)). No results found.    Assessment and Plan: 41 y.o. female with recurrent migraine headache. Will start prophylaxis with topiramate. Additionally refer to headache clinic. Recheck in 1-2 months.   Orders Placed This Encounter  Procedures  . Ambulatory referral to Neurology    Referral Priority:   Routine    Referral Type:   Consultation    Referral Reason:   Specialty Services Required    Requested Specialty:   Neurology    Number of Visits Requested:   1   Meds ordered this encounter  Medications  . DISCONTD: topiramate (TOPAMAX) 50 MG tablet    Sig: One half tab by mouth daily for a week, then one tab by mouth daily.    Dispense:  30 tablet    Refill:  0      Discussed warning signs or symptoms. Please see discharge instructions. Patient expresses understanding.

## 2016-07-19 ENCOUNTER — Telehealth: Payer: Self-pay

## 2016-07-19 NOTE — Telephone Encounter (Signed)
Pt is experiencing pins and needles sensations all over her body which she thinks is from the topamax.  She started on it Wed of last week with a half a tab a day, and yesterday she started with these sensations. She stated that they were so bad last night that she couldn't sleep.  She is asking if she should be seen or just stop the medication.  Please advise.

## 2016-07-19 NOTE — Telephone Encounter (Signed)
She should stop medicine

## 2016-07-19 NOTE — Telephone Encounter (Signed)
Notified patient.

## 2016-07-20 ENCOUNTER — Ambulatory Visit (INDEPENDENT_AMBULATORY_CARE_PROVIDER_SITE_OTHER): Payer: BC Managed Care – PPO | Admitting: Family Medicine

## 2016-07-20 VITALS — BP 137/86 | HR 71 | Wt 266.0 lb

## 2016-07-20 DIAGNOSIS — R202 Paresthesia of skin: Secondary | ICD-10-CM

## 2016-07-20 DIAGNOSIS — T887XXA Unspecified adverse effect of drug or medicament, initial encounter: Secondary | ICD-10-CM | POA: Diagnosis not present

## 2016-07-20 DIAGNOSIS — T50905A Adverse effect of unspecified drugs, medicaments and biological substances, initial encounter: Secondary | ICD-10-CM

## 2016-07-20 MED ORDER — AMITRIPTYLINE HCL 25 MG PO TABS: 12.5000 mg | ORAL_TABLET | Freq: Every day | ORAL | 1 refills | Status: DC

## 2016-07-20 NOTE — Patient Instructions (Addendum)
Thank you for coming in today. STOP tomamax.  When feeling better start amitriptyline at night.  This medicine will make you sleepy so take it at bedtime.  If we cannot get your headaches better we will work to getting you to a headache doctor.   Amitriptyline tablets What is this medicine? AMITRIPTYLINE (a mee TRIP ti leen) is used to treat depression. This medicine may be used for other purposes; ask your health care provider or pharmacist if you have questions. COMMON BRAND NAME(S): Elavil, Vanatrip What should I tell my health care provider before I take this medicine? They need to know if you have any of these conditions: -an alcohol problem -asthma, difficulty breathing -bipolar disorder or schizophrenia -difficulty passing urine, prostate trouble -glaucoma -heart disease or previous heart attack -liver disease -over active thyroid -seizures -thoughts or plans of suicide, a previous suicide attempt, or family history of suicide attempt -an unusual or allergic reaction to amitriptyline, other medicines, foods, dyes, or preservatives -pregnant or trying to get pregnant -breast-feeding How should I use this medicine? Take this medicine by mouth with a drink of water. Follow the directions on the prescription label. You can take the tablets with or without food. Take your medicine at regular intervals. Do not take it more often than directed. Do not stop taking this medicine suddenly except upon the advice of your doctor. Stopping this medicine too quickly may cause serious side effects or your condition may worsen. A special MedGuide will be given to you by the pharmacist with each prescription and refill. Be sure to read this information carefully each time. Talk to your pediatrician regarding the use of this medicine in children. Special care may be needed. Overdosage: If you think you have taken too much of this medicine contact a poison control center or emergency room at  once. NOTE: This medicine is only for you. Do not share this medicine with others. What if I miss a dose? If you miss a dose, take it as soon as you can. If it is almost time for your next dose, take only that dose. Do not take double or extra doses. What may interact with this medicine? Do not take this medicine with any of the following medications: -arsenic trioxide -certain medicines used to regulate abnormal heartbeat or to treat other heart conditions -cisapride -droperidol -halofantrine -linezolid -MAOIs like Carbex, Eldepryl, Marplan, Nardil, and Parnate -methylene blue -other medicines for mental depression -phenothiazines like perphenazine, thioridazine and chlorpromazine -pimozide -probucol -procarbazine -sparfloxacin -St. John's Wort -ziprasidone This medicine may also interact with the following medications: -atropine and related drugs like hyoscyamine, scopolamine, tolterodine and others -barbiturate medicines for inducing sleep or treating seizures, like phenobarbital -cimetidine -disulfiram -ethchlorvynol -thyroid hormones such as levothyroxine This list may not describe all possible interactions. Give your health care provider a list of all the medicines, herbs, non-prescription drugs, or dietary supplements you use. Also tell them if you smoke, drink alcohol, or use illegal drugs. Some items may interact with your medicine. What should I watch for while using this medicine? Tell your doctor if your symptoms do not get better or if they get worse. Visit your doctor or health care professional for regular checks on your progress. Because it may take several weeks to see the full effects of this medicine, it is important to continue your treatment as prescribed by your doctor. Patients and their families should watch out for new or worsening thoughts of suicide or depression. Also watch out for sudden  changes in feelings such as feeling anxious, agitated, panicky,  irritable, hostile, aggressive, impulsive, severely restless, overly excited and hyperactive, or not being able to sleep. If this happens, especially at the beginning of treatment or after a change in dose, call your health care professional. Dennis Bast may get drowsy or dizzy. Do not drive, use machinery, or do anything that needs mental alertness until you know how this medicine affects you. Do not stand or sit up quickly, especially if you are an older patient. This reduces the risk of dizzy or fainting spells. Alcohol may interfere with the effect of this medicine. Avoid alcoholic drinks. Do not treat yourself for coughs, colds, or allergies without asking your doctor or health care professional for advice. Some ingredients can increase possible side effects. Your mouth may get dry. Chewing sugarless gum or sucking hard candy, and drinking plenty of water will help. Contact your doctor if the problem does not go away or is severe. This medicine may cause dry eyes and blurred vision. If you wear contact lenses you may feel some discomfort. Lubricating drops may help. See your eye doctor if the problem does not go away or is severe. This medicine can cause constipation. Try to have a bowel movement at least every 2 to 3 days. If you do not have a bowel movement for 3 days, call your doctor or health care professional. This medicine can make you more sensitive to the sun. Keep out of the sun. If you cannot avoid being in the sun, wear protective clothing and use sunscreen. Do not use sun lamps or tanning beds/booths. What side effects may I notice from receiving this medicine? Side effects that you should report to your doctor or health care professional as soon as possible: -allergic reactions like skin rash, itching or hives, swelling of the face, lips, or tongue -anxious -breathing problems -changes in vision -confusion -elevated mood, decreased need for sleep, racing thoughts, impulsive behavior -eye  pain -fast, irregular heartbeat -feeling faint or lightheaded, falls -feeling agitated, angry, or irritable -fever with increased sweating -hallucination, loss of contact with reality -seizures -stiff muscles -suicidal thoughts or other mood changes -tingling, pain, or numbness in the feet or hands -trouble passing urine or change in the amount of urine -trouble sleeping -unusually weak or tired -vomiting -yellowing of the eyes or skin Side effects that usually do not require medical attention (report to your doctor or health care professional if they continue or are bothersome): -change in sex drive or performance -change in appetite or weight -constipation -dizziness -dry mouth -nausea -tired -tremors -upset stomach This list may not describe all possible side effects. Call your doctor for medical advice about side effects. You may report side effects to FDA at 1-800-FDA-1088. Where should I keep my medicine? Keep out of the reach of children. Store at room temperature between 20 and 25 degrees C (68 and 77 degrees F). Throw away any unused medicine after the expiration date. NOTE: This sheet is a summary. It may not cover all possible information. If you have questions about this medicine, talk to your doctor, pharmacist, or health care provider.  2018 Elsevier/Gold Standard (2015-08-29 12:14:15)

## 2016-07-20 NOTE — Progress Notes (Signed)
Carrie Gibson is a 41 y.o. female who presents to [facility] today for paresthesias. Patient was seen last week for chronic headache and was started on Topamax. She's been taking 25 mg of Topamax for about for 5 days when she developed significant bothersome paresthesias. She describes burning tingling pain across her back and extremities. She stopped taking the Topamax about 36 hours ago and notes symptoms are improving.   Past Medical History:  Diagnosis Date  . Asthma   . Environmental allergies   . Morbid obesity ([place])    Past Surgical History:  Procedure Laterality Date  . CESAREAN SECTION N/A 01/11/2015   Procedure: CESAREAN SECTION;  Surgeon: Emily Filbert, MD;  Location: [facility];  Service: Obstetrics;  Laterality: N/A;  . FOOT SURGERY  1995   Social History  Substance Use Topics  . Smoking status: Former Research scientist (life sciences)  . Smokeless tobacco: Not on file  . Alcohol use No   family history includes Fibroids in her mother and sister; Mental illness in her father.  ROS as above:  Medications: Current Outpatient Prescriptions  Medication Sig Dispense Refill  . albuterol (PROAIR HFA) 108 (90 Base) MCG/ACT inhaler INHALE 2 PUFFS INTO THE LUNGS EVERY 6 (SIX) HOURS AS NEEDED FOR WHEEZING OR SHORTNESS OF BREATH. 8.5 Inhaler 2  . amitriptyline (ELAVIL) 25 MG tablet Take 0.5-1 tablets (12.5-25 mg total) by mouth at bedtime. 30 tablet 1  . beclomethasone (QVAR) 80 MCG/ACT inhaler TWO PUFFS INHALED TWICE A DAY TO PREVENT ASTHMA SYMPTOMS. 26.1 g 12  . fluticasone (FLONASE) 50 MCG/ACT nasal spray Place 2 sprays into both nostrils daily. 16 g 12  . ibuprofen (ADVIL,MOTRIN) 600 MG tablet Take 1 tablet (600 mg total) by mouth every 6 (six) hours. 30 tablet 0  . omeprazole (PRILOSEC) 40 MG capsule Take 1 capsule (40 mg total) by mouth daily. 90 capsule 3  . Prenatal Vit-Fe Fumarate-FA  (PRENATAL VITAMIN) 27-0.8 MG TABS Take 1 tablet by mouth daily.     . SUMAtriptan (IMITREX) 50 MG tablet Take 1 tablet (50 mg total) by mouth every 2 (two) hours as needed for migraine. May repeat in 2 hours if headache persists or recurs. 30 tablet 2  . terbinafine (LAMISIL AT) 1 % cream Apply 1 application topically 2 (two) times daily. 30 g 0   No current facility-administered medications for this visit.    Allergies  Allergen Reactions  . Topamax [Topiramate] Other (See Comments)    Bad paresthesia     Health Maintenance Health Maintenance  Topic Date Due  . INFLUENZA VACCINE  11/10/2016  . PAP SMEAR  06/07/2017  . TETANUS/TDAP  10/27/2024  . HIV Screening  Completed     Exam:  BP 137/86   Pulse 71   Wt 266 lb (120.7 kg)   BMI 42.93 kg/m  Gen: Well NAD HEENT: EOMI,  MMM Lungs: Normal work of breathing. CTABL Heart: RRR no MRG Abd: NABS, Soft. Nondistended, Nontender Exts: Brisk capillary refill, warm and well perfused.  Neuro: Alert and oriented normal balance gait and sensation.   No results found for this or any previous visit (from the past 72 hour(s)). No results found.    Assessment and Plan: 41 y.o. female with adverse effect of Topamax. Paresthesias are common side effect of this medicine however her symptoms were quite severe. Topamax placed on medicine allergy list. I expect patient will continue to improve his Topamax has about a  24 hour half-life.  For headache disorder start taking low dose amitriptyline at night in a few days when she started to feel better.   No orders of the defined types were placed in this encounter.  Meds ordered this encounter  Medications  . amitriptyline (ELAVIL) 25 MG tablet    Sig: Take 0.5-1 tablets (12.5-25 mg total) by mouth at bedtime.    Dispense:  30 tablet    Refill:  1     Discussed warning signs or symptoms. Please see discharge instructions. Patient expresses understanding.

## 2016-07-28 ENCOUNTER — Ambulatory Visit (INDEPENDENT_AMBULATORY_CARE_PROVIDER_SITE_OTHER): Payer: BC Managed Care – PPO | Admitting: Family Medicine

## 2016-07-28 VITALS — BP 128/74 | HR 87 | Temp 98.0°F

## 2016-07-28 DIAGNOSIS — R51 Headache: Secondary | ICD-10-CM | POA: Diagnosis not present

## 2016-07-28 DIAGNOSIS — G8929 Other chronic pain: Secondary | ICD-10-CM

## 2016-07-28 DIAGNOSIS — H1031 Unspecified acute conjunctivitis, right eye: Secondary | ICD-10-CM | POA: Diagnosis not present

## 2016-07-28 MED ORDER — POLYMYXIN B-TRIMETHOPRIM 10000-0.1 UNIT/ML-% OP SOLN: 1.0000 [drp] | OPHTHALMIC | 0 refills | Status: DC

## 2016-07-28 NOTE — Patient Instructions (Signed)
Thank you for coming in today. Use over-the-counter Zaditor eyedrops (Ketotifen) Use over-the-counter Zyrtec (cetirizine)  Use Systane artificial tears as needed Also use polytrim antibiotic drops.   Return as needed.  Call or go to the emergency room if you get worse, have trouble breathing, have chest pains, or palpitations.    Allergic Conjunctivitis, Adult Allergic conjunctivitis is inflammation of the clear membrane that covers the white part of your eye and the inner surface of your eyelid (conjunctiva). The inflammation is caused by allergies. The blood vessels in the conjunctiva become inflamed and this causes the eyes to become red or pink. The eyes often feel itchy. Allergic conjunctivitis cannot be spread from one person to another person (is not contagious). What are the causes? This condition is caused by an allergic reaction. Common causes of an allergic reaction (allergens) include:  Outdoor allergens, such as:  Pollen.  Grass and weeds.  Mold spores.  Indoor allergens, such as:  Dust.  Smoke.  Mold.  Pet dander.  Animal hair. What increases the risk? You may be more likely to develop this condition if you have a family history of allergies, such as:  Allergic rhinitis.  Bronchial asthma.  Atopic dermatitis. What are the signs or symptoms? Symptoms of this condition include eyes that are:  Itchy.  Red.  Watery.  Puffy. Your eyes may also:  Sting or burn.  Have clear drainage coming from them. How is this diagnosed? This condition may be diagnosed by medical history and physical exam. If you have drainage from your eyes, it may be tested to rule out other causes of conjunctivitis. You may also need to see a health care provider who specializes in treating allergies (allergist) or eye conditions (ophthalmologist) for tests to confirm the diagnosis. You may have:  Skin tests to see which allergens are causing your symptoms. These tests involve  pricking the skin with a tiny needle and exposing the skin to small amounts of potential allergens to see if your skin reacts.  Blood tests.  Tissue scrapings from your eyelid. These will be examined under a microscope. How is this treated? Treatments for this condition may include:  Cold cloths (compresses) to soothe itching and swelling.  Washing the face to remove allergens.  Eye drops. These may be prescription or over-the-counter. There are several different types. You may need to try different types to see which one works best for you. Your may need:  Eye drops that block the allergic reaction (antihistamine).  Eye drops that reduce swelling and irritation (anti-inflammatory).  Steroid eye drops to lessen a severe reaction (vernal conjunctivitis).  Oral antihistamine medicines to reduce your allergic reaction. You may need these if eye drops do not help or are difficult to use. Follow these instructions at home:  Avoid known allergens whenever possible.  Take or apply over-the-counter and prescription medicines only as told by your health care provider. These include any eye drops.  Apply a cool, clean washcloth to your eye for 10-20 minutes, 3-4 times a day.  Do not touch or rub your eyes.  Do not wear contact lenses until the inflammation is gone. Wear glasses instead.  Do not wear eye makeup until the inflammation is gone.  Keep all follow-up visits as told by your health care provider. This is important. Contact a health care provider if:  Your symptoms get worse or do not improve with treatment.  You have mild eye pain.  You have sensitivity to light.  You have  spots or blisters on your eyes.  You have pus draining from your eye.  You have a fever. Get help right away if:  You have redness, swelling, or other symptoms in only one eye.  Your vision is blurred or you have vision changes.  You have severe eye pain. This information is not intended to  replace advice given to you by your health care provider. Make sure you discuss any questions you have with your health care provider. Document Released: 06/19/2002 Document Revised: 11/26/2015 Document Reviewed: 10/10/2015 Elsevier Interactive Patient Education  2017 [employer].

## 2016-07-28 NOTE — Progress Notes (Signed)
Carrie Gibson is a 41 y.o. female who presents to [facility] [place] today for right eye pain and redness. Symptoms present for a few days associated with some itching and runny nose. She has tried some OTC medications which have helped a bit. She denies any fever or chills. She notes mild blurry vision.   She also was seen about a week ago for headache. She was started on amitriptyline QHS and is feeling well. She has not had a headache since starting the medicine.   Past Medical History:  Diagnosis Date  . Asthma   . Environmental allergies   . Morbid obesity ([place])    Past Surgical History:  Procedure Laterality Date  . CESAREAN SECTION N/A 01/11/2015   Procedure: CESAREAN SECTION;  Surgeon: Emily Filbert, MD;  Location: [facility];  Service: Obstetrics;  Laterality: N/A;  . FOOT SURGERY  1995   Social History  Substance Use Topics  . Smoking status: Former Research scientist (life sciences)  . Smokeless tobacco: Not on file  . Alcohol use No   family history includes Fibroids in her mother and sister; Mental illness in her father.  ROS as above:  Medications: Current Outpatient Prescriptions  Medication Sig Dispense Refill  . albuterol (PROAIR HFA) 108 (90 Base) MCG/ACT inhaler INHALE 2 PUFFS INTO THE LUNGS EVERY 6 (SIX) HOURS AS NEEDED FOR WHEEZING OR SHORTNESS OF BREATH. 8.5 Inhaler 2  . amitriptyline (ELAVIL) 25 MG tablet Take 0.5-1 tablets (12.5-25 mg total) by mouth at bedtime. 30 tablet 1  . beclomethasone (QVAR) 80 MCG/ACT inhaler TWO PUFFS INHALED TWICE A DAY TO PREVENT ASTHMA SYMPTOMS. 26.1 g 12  . fluticasone (FLONASE) 50 MCG/ACT nasal spray Place 2 sprays into both nostrils daily. 16 g 12  . ibuprofen (ADVIL,MOTRIN) 600 MG tablet Take 1 tablet (600 mg total) by mouth every 6 (six) hours. 30 tablet 0  . omeprazole (PRILOSEC) 40 MG capsule Take 1 capsule (40 mg total) by mouth daily. 90  capsule 3  . Prenatal Vit-Fe Fumarate-FA (PRENATAL VITAMIN) 27-0.8 MG TABS Take 1 tablet by mouth daily.     . SUMAtriptan (IMITREX) 50 MG tablet Take 1 tablet (50 mg total) by mouth every 2 (two) hours as needed for migraine. May repeat in 2 hours if headache persists or recurs. 30 tablet 2  . terbinafine (LAMISIL AT) 1 % cream Apply 1 application topically 2 (two) times daily. 30 g 0  . trimethoprim-polymyxin b (POLYTRIM) ophthalmic solution Place 1 drop into the right eye every 4 (four) hours. 10 mL 0   No current facility-administered medications for this visit.    Allergies  Allergen Reactions  . Topamax [Topiramate] Other (See Comments)    Bad paresthesia     Health Maintenance Health Maintenance  Topic Date Due  . INFLUENZA VACCINE  11/10/2016  . PAP SMEAR  06/07/2017  . TETANUS/TDAP  10/27/2024  . HIV Screening  Completed     Exam:  BP 128/74   Pulse 87   Temp 98 F (36.7 C) (Oral)   SpO2 97%  Gen: Well NAD HEENT: EOMI,  MMM Right eye mild conjunctival injection. Normal pupillary response. Normal eye motion. Visual acuity 25/20 right eye Lungs: Normal work of breathing. CTABL Heart: RRR no MRG Abd: NABS, Soft. Nondistended, Nontender Exts: Brisk capillary refill, warm and well perfused.    No results found for this or any previous visit (from the past 72 hour(s)). No results found.  Assessment and Plan: 41 y.o. female with conjunctivitis likely viral versus allergic. Plan for symptomatic management with Zaditor and Zyrtec. We'll use backup Polytrim antibiotic eyedrops if not better.  Headache disorder seems to be well-controlled. Continue amitriptyline.  No orders of the defined types were placed in this encounter.  Meds ordered this encounter  Medications  . trimethoprim-polymyxin b (POLYTRIM) ophthalmic solution    Sig: Place 1 drop into the right eye every 4 (four) hours.    Dispense:  10 mL    Refill:  0     Discussed warning signs or  symptoms. Please see discharge instructions. Patient expresses understanding.

## 2016-08-24 ENCOUNTER — Other Ambulatory Visit: Payer: Self-pay | Admitting: Family Medicine

## 2016-08-24 DIAGNOSIS — G8929 Other chronic pain: Secondary | ICD-10-CM

## 2016-08-24 DIAGNOSIS — R51 Headache: Principal | ICD-10-CM

## 2016-09-16 ENCOUNTER — Other Ambulatory Visit: Payer: Self-pay | Admitting: Family Medicine

## 2016-09-17 ENCOUNTER — Other Ambulatory Visit: Payer: Self-pay | Admitting: Family Medicine

## 2016-10-19 ENCOUNTER — Other Ambulatory Visit: Payer: Self-pay | Admitting: Family Medicine

## 2016-11-21 ENCOUNTER — Other Ambulatory Visit: Payer: Self-pay | Admitting: Family Medicine

## 2016-12-28 ENCOUNTER — Encounter: Payer: Self-pay | Admitting: Family Medicine

## 2016-12-29 MED ORDER — BECLOMETHASONE DIPROP HFA 80 MCG/ACT IN AERB: 1.0000 | INHALATION_SPRAY | Freq: Two times a day (BID) | RESPIRATORY_TRACT | 12 refills | Status: DC

## 2017-01-17 ENCOUNTER — Other Ambulatory Visit: Payer: Self-pay | Admitting: Family Medicine

## 2017-02-07 ENCOUNTER — Other Ambulatory Visit: Payer: Self-pay | Admitting: Family Medicine

## 2017-02-15 ENCOUNTER — Ambulatory Visit: Payer: BC Managed Care – PPO | Admitting: Sports Medicine

## 2017-02-15 ENCOUNTER — Ambulatory Visit (INDEPENDENT_AMBULATORY_CARE_PROVIDER_SITE_OTHER): Payer: BC Managed Care – PPO

## 2017-02-15 DIAGNOSIS — M549 Dorsalgia, unspecified: Secondary | ICD-10-CM

## 2017-02-15 DIAGNOSIS — S39012A Strain of muscle, fascia and tendon of lower back, initial encounter: Secondary | ICD-10-CM | POA: Diagnosis not present

## 2017-02-15 MED ORDER — PREDNISONE 50 MG PO TABS: ORAL_TABLET | ORAL | 0 refills | Status: DC

## 2017-02-15 MED ORDER — CYCLOBENZAPRINE HCL 10 MG PO TABS: ORAL_TABLET | ORAL | 0 refills | Status: DC

## 2017-02-15 NOTE — Progress Notes (Signed)
   Subjective:    I'm seeing this patient as a consultation for: Dr. Lynne Leader  CC: Low back pain  HPI: This is a pleasant 41 year old female, while lifting her son out of the car she felt a strain and a pain in her low back, since then she had pain with most movements, better with keeping still, no bowel or bladder dysfunction, saddle numbness, no radicular symptoms, no constitutional symptoms, no trauma.  Past medical history, Surgical history, Family history not pertinant except as noted below, Social history, Allergies, and medications have been entered into the medical record, reviewed, and no changes needed.   Review of Systems: No headache, visual changes, nausea, vomiting, diarrhea, constipation, dizziness, abdominal pain, skin rash, fevers, chills, night sweats, weight loss, swollen lymph nodes, body aches, joint swelling, muscle aches, chest pain, shortness of breath, mood changes, visual or auditory hallucinations.   Objective:   General: Well Developed, well nourished, and in no acute distress.  Neuro:  Extra-ocular muscles intact, able to move all 4 extremities, sensation grossly intact.  Deep tendon reflexes tested were normal. Psych: Alert and oriented, mood congruent with affect. ENT:  Ears and nose appear unremarkable.  Hearing grossly normal. Neck: Unremarkable overall appearance, trachea midline.  No visible thyroid enlargement. Eyes: Conjunctivae and lids appear unremarkable.  Pupils equal and round. Skin: Warm and dry, no rashes noted.  Cardiovascular: Pulses palpable, no extremity edema. Back Exam:  Inspection: Unremarkable  Motion: Flexion 45 deg, Extension 45 deg, Side Bending to 45 deg bilaterally,  Rotation to 45 deg bilaterally  SLR laying: Negative  XSLR laying: Negative  Palpable tenderness: Tenderness across the low back. FABER: negative. Sensory change: Gross sensation intact to all lumbar and sacral dermatomes.  Reflexes: 2+ at both patellar tendons,  2+ at achilles tendons, Babinski's downgoing.  Strength at foot  Plantar-flexion: 5/5 Dorsi-flexion: 5/5 Eversion: 5/5 Inversion: 5/5  Leg strength  Quad: 5/5 Hamstring: 5/5 Hip flexor: 5/5 Hip abductors: 5/5  Gait unremarkable.  Toradol 30 mg intramuscular  Impression and Recommendations:   This case required medical decision making of moderate complexity.  Lumbar strain While lifting her son out of his baby seat. Toradol 30 intramuscular, prednisone, cyclobenzaprine, rehab exercises and baseline x-rays. Work note given, return to see me in 2 weeks. No red flags.  ___________________________________________ Gwen Her. Dianah Field, M.D., ABFM., CAQSM. Primary Care and [place] Instructor of [place] of [facility] of Medicine

## 2017-02-15 NOTE — Assessment & Plan Note (Signed)
While lifting her son out of his baby seat. Toradol 30 intramuscular, prednisone, cyclobenzaprine, rehab exercises and baseline x-rays. Work note given, return to see me in 2 weeks. No red flags.

## 2017-02-25 ENCOUNTER — Encounter: Payer: Self-pay | Admitting: Family Medicine

## 2017-02-25 ENCOUNTER — Encounter: Payer: Self-pay | Admitting: Sports Medicine

## 2017-02-28 MED ORDER — FLUTICASONE FUROATE 100 MCG/ACT IN AEPB: 1.0000 | INHALATION_SPRAY | Freq: Every day | RESPIRATORY_TRACT | 12 refills | Status: DC

## 2017-03-02 ENCOUNTER — Ambulatory Visit: Payer: BC Managed Care – PPO | Admitting: Sports Medicine

## 2017-04-13 ENCOUNTER — Encounter: Payer: Self-pay | Admitting: Physician Assistant

## 2017-04-13 ENCOUNTER — Ambulatory Visit: Payer: BC Managed Care – PPO | Admitting: Physician Assistant

## 2017-04-13 VITALS — BP 108/76 | HR 96 | Temp 98.3°F | Resp 16 | Ht 67.0 in | Wt 290.9 lb

## 2017-04-13 DIAGNOSIS — Z20818 Contact with and (suspected) exposure to other bacterial communicable diseases: Secondary | ICD-10-CM

## 2017-04-13 DIAGNOSIS — R062 Wheezing: Secondary | ICD-10-CM

## 2017-04-13 DIAGNOSIS — J00 Acute nasopharyngitis [common cold]: Secondary | ICD-10-CM | POA: Diagnosis not present

## 2017-04-13 LAB — POCT RAPID STREP A (OFFICE): RAPID STREP A SCREEN: NEGATIVE

## 2017-04-13 MED ORDER — IPRATROPIUM BROMIDE 0.06 % NA SOLN: 1.0000 | Freq: Four times a day (QID) | NASAL | 0 refills | Status: DC | PRN

## 2017-04-13 MED ORDER — LIDOCAINE VISCOUS 2 % MT SOLN: 10.0000 mL | OROMUCOSAL | 0 refills | Status: DC | PRN

## 2017-04-13 MED ORDER — ALBUTEROL SULFATE (2.5 MG/3ML) 0.083% IN NEBU
2.5000 mg | INHALATION_SOLUTION | RESPIRATORY_TRACT | Status: AC
  Administered 2017-04-13: 2.5 mg via RESPIRATORY_TRACT

## 2017-04-13 NOTE — Patient Instructions (Signed)
Upper Respiratory Infection, Adult Most upper respiratory infections (URIs) are caused by a virus. A URI affects the nose, throat, and upper air passages. The most common type of URI is often called "the common cold." Follow these instructions at home:  Take medicines only as told by your doctor.  Gargle warm saltwater or take cough drops to comfort your throat as told by your doctor.  Use a warm mist humidifier or inhale steam from a shower to increase air moisture. This may make it easier to breathe.  Drink enough fluid to keep your pee (urine) clear or pale yellow.  Eat soups and other clear broths.  Have a healthy diet.  Rest as needed.  Go back to work when your fever is gone or your doctor says it is okay. ? You may need to stay home longer to avoid giving your URI to others. ? You can also wear a face mask and wash your hands often to prevent spread of the virus.  Use your inhaler more if you have asthma.  Do not use any tobacco products, including cigarettes, chewing tobacco, or electronic cigarettes. If you need help quitting, ask your doctor. Contact a doctor if:  You are getting worse, not better.  Your symptoms are not helped by medicine.  You have chills.  You are getting more short of breath.  You have brown or red mucus.  You have yellow or brown discharge from your nose.  You have pain in your face, especially when you bend forward.  You have a fever.  You have puffy (swollen) neck glands.  You have pain while swallowing.  You have white areas in the back of your throat. Get help right away if:  You have very bad or constant: ? Headache. ? Ear pain. ? Pain in your forehead, behind your eyes, and over your cheekbones (sinus pain). ? Chest pain.  You have long-lasting (chronic) lung disease and any of the following: ? Wheezing. ? Long-lasting cough. ? Coughing up blood. ? A change in your usual mucus.  You have a stiff neck.  You have  changes in your: ? Vision. ? Hearing. ? Thinking. ? Mood. This information is not intended to replace advice given to you by your health care provider. Make sure you discuss any questions you have with your health care provider. Document Released: 09/15/2007 Document Revised: 11/30/2015 Document Reviewed: 07/04/2013 Elsevier Interactive Patient Education  2018 Elsevier Inc.  

## 2017-04-13 NOTE — Progress Notes (Signed)
HPI:                                                                Carrie Gibson is a 42 y.o. female who presents to [place]: [place] today for sore throat and ear pain  Reports sore throat, tender swollen lymph nodes, nasal congestion and bilateral ear fullness/pressure for x 2 days. Reports wheezing, but has not needed rescue inhaler. She is on qvar for asthma. Denies fever, chills, cough, dyspnea. Reports she was exposed to Strep throat.    Past Medical History:  Diagnosis Date  . Asthma   . Environmental allergies   . Morbid obesity ([place])    Past Surgical History:  Procedure Laterality Date  . CESAREAN SECTION N/A 01/11/2015   Procedure: CESAREAN SECTION;  Surgeon: Emily Filbert, MD;  Location: [facility];  Service: Obstetrics;  Laterality: N/A;  . FOOT SURGERY  1995   Social History   Tobacco Use  . Smoking status: Former Research scientist (life sciences)  . Smokeless tobacco: Never Used  Substance Use Topics  . Alcohol use: No    Alcohol/week: 0.0 oz   family history includes Fibroids in her mother and sister; Mental illness in her father.  ROS: negative except as noted in the HPI  Medications: Current Outpatient Medications  Medication Sig Dispense Refill  . albuterol (PROVENTIL HFA;VENTOLIN HFA) 108 (90 Base) MCG/ACT inhaler INHALE 2 PUFFS INTO THE LUNGS EVERY 6 (SIX) HOURS AS NEEDED FOR WHEEZING OR SHORTNESS OF BREATH. 8.5 Inhaler 2  . amitriptyline (ELAVIL) 25 MG tablet TAKE 0.5-1 TABLETS (12.5-25 MG TOTAL) BY MOUTH AT BEDTIME. 30 tablet 11  . beclomethasone (QVAR REDIHALER) 80 MCG/ACT inhaler Inhale 1 puff into the lungs 2 (two) times daily. 1 Inhaler 12  . fluticasone (FLONASE) 50 MCG/ACT nasal spray SPRAY 2 SPRAYS INTO EACH NOSTRIL EVERY DAY 16 g prn  . omeprazole (PRILOSEC) 40 MG capsule TAKE 1 CAPSULE (40 MG TOTAL) BY MOUTH DAILY. 90 capsule 3  . SUMAtriptan (IMITREX) 50 MG tablet Take 1 tablet (50 mg total) by mouth every 2 (two) hours as needed for  migraine. May repeat in 2 hours if headache persists or recurs. 30 tablet 2   No current facility-administered medications for this visit.    Allergies  Allergen Reactions  . Topamax [Topiramate] Other (See Comments)    Bad paresthesia        Objective:  BP 108/76   Pulse 96   Temp 98.3 F (36.8 C) (Oral)   Resp 16   Ht 5\' 7"  (1.702 m)   Wt 290 lb 14.4 oz (132 kg)   LMP 04/03/2017 (Within Months)   SpO2 96%   BMI 45.56 kg/m  Gen:  alert, not ill-appearing, no distress, appropriate for age, morbidly obese female HEENT: head normocephalic without obvious abnormality, conjunctiva and cornea clear, oropharynx with erythema, no tonsillar exudates, uvula midline, there is tender tonsillar adenopathy, neck supple, trachea midline Pulm: Normal work of breathing, normal phonation, end expiratory wheeze in the right lower lobe, otherwise clear to auscultation CV: Normal rate, regular rhythm, s1 and s2 distinct, no murmurs, clicks or rubs  Neuro: alert and oriented x 3, no tremor MSK: extremities atraumatic, normal gait and station Skin: intact, no rashes on exposed skin,  no jaundice, no cyanosis Psych: well-groomed, cooperative, good eye contact, euthymic mood, affect mood-congruent, speech is articulate, and thought processes clear and goal-directed  No flowsheet data found.   No results found for this or any previous visit (from the past 72 hour(s)). No results found.    Assessment and Plan: 42 y.o. female with   1. Acute nasopharyngitis - POCT rapid strep A negative - symptomatic managment - albuterol (PROVENTIL) (2.5 MG/3ML) 0.083% nebulizer solution 2.5 mg  - lidocaine (XYLOCAINE) 2 % solution; Use as directed 10 mLs in the mouth or throat every 3 (three) hours as needed for mouth pain. Gargle and spit  Dispense: 100 mL; Refill: 0  2. Exposure to group A Streptococcus - POCT rapid strep A negative  3. Wheezing on right side of chest on exhalation - end expiratory  wheeze in right lower lobe, resolved with Duoneb. I think this is patient's persistent asthma rather than an acute exacerbation. Continue Albuterol prn and Qvar daily - ipratropium (ATROVENT) 0.06 % nasal spray; Place 1 spray into both nostrils 4 (four) times daily as needed.  Dispense: 15 mL; Refill: 0     Patient education and anticipatory guidance given Patient agrees with treatment plan Follow-up as needed if symptoms worsen or fail to improve  Darlyne Russian PA-C

## 2017-08-11 ENCOUNTER — Other Ambulatory Visit: Payer: Self-pay

## 2017-08-11 MED ORDER — AMITRIPTYLINE HCL 25 MG PO TABS: 12.5000 mg | ORAL_TABLET | Freq: Every day | ORAL | 0 refills | Status: DC

## 2017-08-13 ENCOUNTER — Other Ambulatory Visit: Payer: Self-pay | Admitting: Family Medicine

## 2017-09-28 ENCOUNTER — Other Ambulatory Visit: Payer: Self-pay | Admitting: Family Medicine

## 2017-09-28 ENCOUNTER — Other Ambulatory Visit: Payer: Self-pay

## 2017-09-28 ENCOUNTER — Encounter: Payer: Self-pay | Admitting: Family Medicine

## 2017-09-28 ENCOUNTER — Ambulatory Visit: Payer: BC Managed Care – PPO | Admitting: Family Medicine

## 2017-09-28 MED ORDER — AMITRIPTYLINE HCL 25 MG PO TABS: 12.5000 mg | ORAL_TABLET | Freq: Every day | ORAL | 1 refills | Status: DC

## 2017-09-28 NOTE — Telephone Encounter (Signed)
Patient request refill for Amitriptyline 25 mg. Patient stated that she takes this for migraines. Please send [facility]. Oneta Rack

## 2017-12-19 ENCOUNTER — Encounter: Payer: Self-pay | Admitting: Family Medicine

## 2017-12-20 MED ORDER — FLUTICASONE PROPIONATE 50 MCG/ACT NA SUSP: NASAL | 99 refills | Status: DC

## 2018-01-21 ENCOUNTER — Other Ambulatory Visit: Payer: Self-pay | Admitting: Family Medicine

## 2018-03-07 ENCOUNTER — Other Ambulatory Visit: Payer: Self-pay | Admitting: Family Medicine

## 2018-06-24 ENCOUNTER — Other Ambulatory Visit: Payer: Self-pay | Admitting: Family Medicine

## 2018-08-03 ENCOUNTER — Encounter: Payer: Self-pay | Admitting: Family Medicine

## 2018-08-07 ENCOUNTER — Encounter: Payer: Self-pay | Admitting: Family Medicine

## 2018-08-07 ENCOUNTER — Ambulatory Visit (INDEPENDENT_AMBULATORY_CARE_PROVIDER_SITE_OTHER): Payer: BC Managed Care – PPO | Admitting: Family Medicine

## 2018-08-07 VITALS — Ht 67.0 in | Wt 258.0 lb

## 2018-08-07 DIAGNOSIS — S39012A Strain of muscle, fascia and tendon of lower back, initial encounter: Secondary | ICD-10-CM

## 2018-08-07 MED ORDER — PREDNISONE 5 MG (48) PO TBPK: ORAL_TABLET | ORAL | 0 refills | Status: DC

## 2018-08-07 MED ORDER — CYCLOBENZAPRINE HCL 10 MG PO TABS: 5.0000 mg | ORAL_TABLET | Freq: Three times a day (TID) | ORAL | 1 refills | Status: DC | PRN

## 2018-08-07 NOTE — Progress Notes (Signed)
Virtual Visit  via Video Note  I connected with      Carrie Gibson by a video enabled telemedicine application and verified that I am speaking with the correct person using two identifiers.   I discussed the limitations of evaluation and management by telemedicine and the availability of in person appointments. The patient expressed understanding and agreed to proceed.  History of Present Illness: Carrie Gibson is a 43 y.o. female who would like to discuss back pain.  Starting Thursday April 23.  She had been doing extra work at home including cleaning out the garage and some tree trimming and lifting and pushing and pulling.  She developed pain starting Thursday the 23rd.  She notes pain in her bilateral lower back.  She denies any pain radiating down her legs.  She denies significant weakness or numbness.  Pain is worse with activity including standing from a seated position.  She notes that when she standing she starts feeling better and can do normal walking without a lot of pain.  She notes her symptoms are consistent previous episodes of a pulled back muscle.  She has been using Aleve and heating pad which do help.  In the past she had benefit with Flexeril and prednisone.      Observations/Objective: Ht 5\' 7"  (1.702 m)   Wt 258 lb (117 kg)   BMI 40.41 kg/m  Wt Readings from Last 5 Encounters:  08/07/18 258 lb (117 kg)  04/13/17 290 lb 14.4 oz (132 kg)  02/15/17 287 lb 6.4 oz (130.4 kg)  07/20/16 266 lb (120.7 kg)  04/22/16 270 lb (122.5 kg)   Exam: Appearance Normal Speech.  L-spine: Normal motion.  Lab and Radiology Results EXAM: LUMBAR SPINE - COMPLETE 4+ VIEW  COMPARISON:  None.  FINDINGS: There is no evidence of lumbar spine fracture. Alignment is normal. Intervertebral disc spaces are maintained.  IMPRESSION: Negative.   Electronically Signed   By: Donavan Foil M.D.   On: 02/15/2017 22:45   Assessment and Plan: 43 y.o. female with lumbosacral strain.   Patient does not have red flag signs or symptoms.  Plan for treatment with home exercise program, heat relative rest NSAIDs Tylenol TENS unit and cyclobenzaprine.  Will use prednisone as a backup if not improving or if worsening.  Also will consider formal physical therapy as a backup as well.  PDMP not reviewed this encounter. No orders of the defined types were placed in this encounter.  Meds ordered this encounter  Medications  . cyclobenzaprine (FLEXERIL) 10 MG tablet    Sig: Take 0.5-1 tablets (5-10 mg total) by mouth 3 (three) times daily as needed for muscle spasms.    Dispense:  60 tablet    Refill:  1  . predniSONE (STERAPRED UNI-PAK 48 TAB) 5 MG (48) TBPK tablet    Sig: 12 day dosepack po    Dispense:  48 tablet    Refill:  0    Follow Up Instructions:    I discussed the assessment and treatment plan with the patient. The patient was provided an opportunity to ask questions and all were answered. The patient agreed with the plan and demonstrated an understanding of the instructions.   The patient was advised to call back or seek an in-person evaluation if the symptoms worsen or if the condition fails to improve as anticipated.  Time: 15 minutes of intraservice time, with >22 minutes of total time during today's visit.      Historical information  moved to improve visibility of documentation.  Past Medical History:  Diagnosis Date  . Asthma   . Environmental allergies   . Morbid obesity ([name])    Past Surgical History:  Procedure Laterality Date  . CESAREAN SECTION N/A 01/11/2015   Procedure: CESAREAN SECTION;  Surgeon: Emily Filbert, MD;  Location: [facility];  Service: Obstetrics;  Laterality: N/A;  . FOOT SURGERY  1995   Social History   Tobacco Use  . Smoking status: Former Research scientist (life sciences)  . Smokeless tobacco: Never Used  Substance Use Topics  . Alcohol use: No    Alcohol/week: 0.0 standard drinks   family history includes Fibroids in her mother and sister; Mental  illness in her father.  Medications: Current Outpatient Medications  Medication Sig Dispense Refill  . albuterol (PROVENTIL HFA;VENTOLIN HFA) 108 (90 Base) MCG/ACT inhaler TAKE 2 PUFFS BY MOUTH EVERY 6 HOURS AS NEEDED FOR WHEEZE OR SHORTNESS OF BREATH 8.5 Inhaler 2  . amitriptyline (ELAVIL) 25 MG tablet TAKE 1/2 TO 1 TABLET BY MOUTH EVERYDAY AT BEDTIME 90 tablet 1  . fluticasone (FLONASE) 50 MCG/ACT nasal spray SPRAY 2 SPRAYS INTO EACH NOSTRIL EVERY DAY 16 g prn  . omeprazole (PRILOSEC) 40 MG capsule TAKE 1 CAPSULE (40 MG TOTAL) BY MOUTH DAILY. 90 capsule 3  . QVAR REDIHALER 80 MCG/ACT inhaler TAKE 1 PUFF BY MOUTH TWICE A DAY 10.6 g 12  . SUMAtriptan (IMITREX) 50 MG tablet TAKE 1 TAB BY MOUTH EVERY 2 HOURS AS NEEDED FOR MIGRAINE. MAY REPEAT IN 2 HOURS IF HEADACHE PERSISTS 30 tablet 0  . cyclobenzaprine (FLEXERIL) 10 MG tablet Take 0.5-1 tablets (5-10 mg total) by mouth 3 (three) times daily as needed for muscle spasms. 60 tablet 1  . predniSONE (STERAPRED UNI-PAK 48 TAB) 5 MG (48) TBPK tablet 12 day dosepack po 48 tablet 0   Current Facility-Administered Medications  Medication Dose Route Frequency Provider Last Rate Last Dose  . albuterol (PROVENTIL) (2.5 MG/3ML) 0.083% nebulizer solution 2.5 mg  2.5 mg Nebulization Q4H Trixie Dredge, PA-C   2.5 mg at 04/13/17 1451   Allergies  Allergen Reactions  . Topamax [Topiramate] Other (See Comments)    Bad paresthesia

## 2018-08-07 NOTE — Patient Instructions (Addendum)
Thank you for coming in today. Ok to continue heating pad.  Ok to continue aleve and Theatre stage manager.  You can also take tylenol with the aleve.  Consider TENS unit.  If not getting better will consider PT.  If worsening ok to fill and take prednisone. Do not take prednisone with aleve. Ok to cut tapering course of prednisone short if better.   TENS UNIT: This is helpful for muscle pain and spasm.   Search and Purchase a TENS 7000 2nd edition at  www.tenspros.com or www.[employer].com It should be less than $30.     TENS unit instructions: Do not shower or bathe with the unit on Turn the unit off before removing electrodes or batteries If the electrodes lose stickiness add a drop of water to the electrodes after they are disconnected from the unit and place on plastic sheet. If you continued to have difficulty, call the TENS unit company to purchase more electrodes. Do not apply lotion on the skin area prior to use. Make sure the skin is clean and dry as this will help prolong the life of the electrodes. After use, always check skin for unusual red areas, rash or other skin difficulties. If there are any skin problems, does not apply electrodes to the same area. Never remove the electrodes from the unit by pulling the wires. Do not use the TENS unit or electrodes other than as directed. Do not change electrode placement without consultating your therapist or physician. Keep 2 fingers with between each electrode. Wear time ratio is 2:1, on to off times.    For example on for 30 minutes off for 15 minutes and then on for 30 minutes off for 15 minutes     Lumbosacral Strain Lumbosacral strain is an injury that causes pain in the lower back (lumbosacral spine). This injury usually occurs from overstretching the muscles or ligaments along your spine. A strain can affect one or more muscles or cord-like tissues that connect bones to other bones (ligaments). What are the causes? This  condition may be caused by:  A hard, direct hit (blow) to the back.  Excessive stretching of the lower back muscles. This may result from: ? A fall. ? Lifting something heavy. ? Repetitive movements such as bending or crouching. What increases the risk? The following factors may increase your risk of getting this condition:  Participating in sports or activities that involve: ? A sudden twist of the back. ? Pushing or pulling motions.  Being overweight or obese.  Having poor strength and flexibility, especially tight hamstrings or weak muscles in the back or abdomen.  Having too much of a curve in the lower back.  Having a pelvis that is tilted forward. What are the signs or symptoms? The main symptom of this condition is pain in the lower back, at the site of the strain. Pain may extend (radiate) down one or both legs. How is this diagnosed? This condition is diagnosed based on:  Your symptoms.  Your medical history.  A physical exam. ? Your health care provider may push on certain areas of your back to determine the source of your pain. ? You may be asked to bend forward, backward, and side to side to assess the severity of your pain and your range of motion.  Imaging tests, such as: ? X-rays. ? MRI.  How is this treated? Treatment for this condition may include:  Putting heat and cold on the affected area.  Medicines to help relieve  pain and relax your muscles (muscle relaxants).  NSAIDs to help reduce swelling and discomfort. When your symptoms improve, it is important to gradually return to your normal routine as soon as possible to reduce pain, avoid stiffness, and avoid loss of muscle strength. Generally, symptoms should improve within 6 weeks of treatment. However, recovery time varies. Follow these instructions at home: Managing pain, stiffness, and swelling   If directed, put ice on the injured area during the first 24 hours after your strain. ? Put ice  in a plastic bag. ? Place a towel between your skin and the bag. ? Leave the ice on for 20 minutes, 2-3 times a day.  If directed, put heat on the affected area as often as told by your health care provider. Use the heat source that your health care provider recommends, such as a moist heat pack or a heating pad. ? Place a towel between your skin and the heat source. ? Leave the heat on for 20-30 minutes. ? Remove the heat if your skin turns bright red. This is especially important if you are unable to feel pain, heat, or cold. You may have a greater risk of getting burned. Activity  Rest and return to your normal activities as told by your health care provider. Ask your health care provider what activities are safe for you.  Avoid activities that take a lot of energy for as long as told by your health care provider. General instructions  Take over-the-counter and prescription medicines only as told by your health care provider.  Donot drive or use heavy machinery while taking prescription pain medicine.  Do not use any products that contain nicotine or tobacco, such as cigarettes and e-cigarettes. If you need help quitting, ask your health care provider.  Keep all follow-up visits as told by your health care provider. This is important. How is this prevented?  Use correct form when playing sports and lifting heavy objects.  Use good posture when sitting and standing.  Maintain a healthy weight.  Sleep on a mattress with medium firmness to support your back.  Be safe and responsible while being active to avoid falls.  Do at least 150 minutes of moderate-intensity exercise each week, such as brisk walking or water aerobics. Try a form of exercise that takes stress off your back, such as swimming or stationary cycling.  Maintain physical fitness, including: ? Strength. ? Flexibility. ? Cardiovascular fitness. ? Endurance. Contact a health care provider if:  Your back pain  does not improve after 6 weeks of treatment.  Your symptoms get worse. Get help right away if:  Your back pain is severe.  You cannot stand or walk.  You have difficulty controlling when you urinate or when you have a bowel movement.  You feel nauseous or you vomit.  Your feet get very cold.  You have numbness, tingling, weakness, or problems using your arms or legs.  You develop any of the following: ? Shortness of breath. ? Dizziness. ? Pain in your legs. ? Weakness in your buttocks or legs. ? Discoloration of the skin on your toes or legs. This information is not intended to replace advice given to you by your health care provider. Make sure you discuss any questions you have with your health care provider. Document Released: 01/06/2005 Document Revised: 10/17/2015 Document Reviewed: 08/31/2015 Elsevier Interactive Patient Education  2019 [employer].

## 2018-08-07 NOTE — Telephone Encounter (Signed)
Visit was completed by the time this message was reviewed

## 2018-08-23 ENCOUNTER — Other Ambulatory Visit: Payer: Self-pay | Admitting: Family Medicine

## 2018-09-21 ENCOUNTER — Other Ambulatory Visit: Payer: Self-pay | Admitting: Family Medicine

## 2018-10-12 ENCOUNTER — Encounter: Payer: Self-pay | Admitting: Family Medicine

## 2018-10-12 ENCOUNTER — Telehealth (INDEPENDENT_AMBULATORY_CARE_PROVIDER_SITE_OTHER): Payer: BC Managed Care – PPO | Admitting: Family Medicine

## 2018-10-12 VITALS — Temp 98.0°F | Ht 67.0 in | Wt 260.0 lb

## 2018-10-12 DIAGNOSIS — J4540 Moderate persistent asthma, uncomplicated: Secondary | ICD-10-CM | POA: Diagnosis not present

## 2018-10-12 MED ORDER — QVAR REDIHALER 80 MCG/ACT IN AERB: 2.0000 | INHALATION_SPRAY | Freq: Two times a day (BID) | RESPIRATORY_TRACT | 12 refills | Status: DC

## 2018-10-12 NOTE — Progress Notes (Signed)
Virtual Visit  via Video Note  I connected with      Carrie Gibson by a video enabled telemedicine application and verified that I am speaking with the correct person using two identifiers.   I discussed the limitations of evaluation and management by telemedicine and the availability of in person appointments. The patient expressed understanding and agreed to proceed.  History of Present Illness: Carrie Gibson is a 43 y.o. female who would like to discuss safety at school.   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  She has been provided a mass to use at school.  She is able to tolerate a mask almost all the time but occasionally she will get very hot and flustered in a mask and sometimes have some wheezing.  She thinks her asthma sometimes gets exacerbated.  Yesterday when she went out she had a little bit of wheezing and chest tightness because of the masks she thinks.  She has used her albuterol inhaler which does help.  She currently is taking Qvar 1 puff twice daily and using Zyrtec once daily as well.  She wants to know if she can use a face shield sometimes in substitution for mask at work.      Observations/Objective: Temp 98 F (36.7 C) (Oral)   Ht 5\' 7"  (1.702 m)   Wt 260 lb (117.9 kg)   BMI 40.72 kg/m  Wt Readings from Last 5 Encounters:  10/12/18 260 lb (117.9 kg)  08/07/18 258 lb (117 kg)  04/13/17 290 lb 14.4 oz (132 kg)  02/15/17 287 lb 6.4 oz (130.4 kg)  07/20/16 266 lb (120.7 kg)   Exam: Appearance nontoxic no acute distress Normal Speech.  No wheezing or tachypnea.  Lab and Radiology Results No results found for this or any previous visit (from the past 72 hour(s)). No results found.   Assessment and Plan: 43 y.o. female with  Asthma.  Slightly worsening.  Likely allergic type.  Plan to temporarily increase Qvar to 2 puffs twice daily.  Also temporarily increase Zyrtec to twice daily.  Continue albuterol as needed.  Lengthy discussion about safety.  Some mask  use is better than no mask use and if they should without a mask is better than nothing at all.  However recommend trying to use a mask as much as possible.  I think it is okay to sometimes use a face shield instead of a mask especially if she is able to distance herself from other people.  Stressed the importance of good hand hygiene especially after touching her face shield or her mask.  Letter written for school.  PDMP not reviewed this encounter. No orders of the defined types were placed in this encounter.  Meds ordered this encounter  Medications  . beclomethasone (QVAR REDIHALER) 80 MCG/ACT inhaler    Sig: Inhale 2 puffs into the lungs 2 (two) times daily.    Dispense:  10.6 g    Refill:  12    Follow Up Instructions:    I discussed the assessment and treatment plan with the patient. The patient was provided an opportunity to ask questions and all were answered. The patient agreed with the plan and demonstrated an understanding of the instructions.   The patient was advised to call back or seek an in-person evaluation if the symptoms worsen or if the condition fails to improve as anticipated.  Time: 15 minutes of intraservice time, with >22 minutes of total time during today's visit.  Historical information moved to improve visibility of documentation.  Past Medical History:  Diagnosis Date  . Asthma   . Environmental allergies   . Morbid obesity ([place])    Past Surgical History:  Procedure Laterality Date  . CESAREAN SECTION N/A 01/11/2015   Procedure: CESAREAN SECTION;  Surgeon: Emily Filbert, MD;  Location: [facility];  Service: Obstetrics;  Laterality: N/A;  . FOOT SURGERY  1995   Social History   Tobacco Use  . Smoking status: Former Research scientist (life sciences)  . Smokeless tobacco: Never Used  Substance Use Topics  . Alcohol use: No    Alcohol/week: 0.0 standard drinks   family history includes Fibroids in her mother and sister; Mental illness in her father.  Medications: Current  Outpatient Medications  Medication Sig Dispense Refill  . albuterol (PROVENTIL HFA;VENTOLIN HFA) 108 (90 Base) MCG/ACT inhaler TAKE 2 PUFFS BY MOUTH EVERY 6 HOURS AS NEEDED FOR WHEEZE OR SHORTNESS OF BREATH 8.5 Inhaler 2  . amitriptyline (ELAVIL) 25 MG tablet TAKE 1/2 TO 1 TABLET BY MOUTH EVERYDAY AT BEDTIME 90 tablet 1  . [START ON 11/11/2018] beclomethasone (QVAR REDIHALER) 80 MCG/ACT inhaler Inhale 2 puffs into the lungs 2 (two) times daily. 10.6 g 12  . cetirizine (ZYRTEC) 10 MG tablet Take 10 mg by mouth daily.    . cyclobenzaprine (FLEXERIL) 10 MG tablet Take 0.5-1 tablets (5-10 mg total) by mouth 3 (three) times daily as needed for muscle spasms. 60 tablet 1  . fluticasone (FLONASE) 50 MCG/ACT nasal spray SPRAY 2 SPRAYS INTO EACH NOSTRIL EVERY DAY 16 g prn  . omeprazole (PRILOSEC) 40 MG capsule TAKE 1 CAPSULE (40 MG TOTAL) BY MOUTH DAILY. 90 capsule 3  . SUMAtriptan (IMITREX) 50 MG tablet TAKE 1 TAB BY MOUTH EVERY 2 HOURS AS NEEDED FOR MIGRAINE. MAY REPEAT IN 2 HOURS IF HEADACHE PERSISTS 30 tablet 12   Current Facility-Administered Medications  Medication Dose Route Frequency Provider Last Rate Last Dose  . albuterol (PROVENTIL) (2.5 MG/3ML) 0.083% nebulizer solution 2.5 mg  2.5 mg Nebulization Q4H Trixie Dredge, PA-C   2.5 mg at 04/13/17 1451   Allergies  Allergen Reactions  . Topamax [Topiramate] Other (See Comments)    Bad paresthesia

## 2018-12-17 ENCOUNTER — Other Ambulatory Visit: Payer: Self-pay | Admitting: Family Medicine

## 2019-02-10 ENCOUNTER — Other Ambulatory Visit: Payer: Self-pay | Admitting: Family Medicine

## 2019-03-23 ENCOUNTER — Encounter: Payer: Self-pay | Admitting: Family Medicine

## 2019-03-26 MED ORDER — ALBUTEROL SULFATE HFA 108 (90 BASE) MCG/ACT IN AERS: 1.0000 | INHALATION_SPRAY | Freq: Four times a day (QID) | RESPIRATORY_TRACT | 1 refills | Status: DC | PRN

## 2019-08-06 ENCOUNTER — Other Ambulatory Visit: Payer: Self-pay | Admitting: Physician Assistant

## 2019-08-06 NOTE — Telephone Encounter (Signed)
Needs to establish with new PCP

## 2019-08-08 NOTE — Telephone Encounter (Signed)
Attempted to call PT. Left VM.

## 2019-08-15 ENCOUNTER — Telehealth (INDEPENDENT_AMBULATORY_CARE_PROVIDER_SITE_OTHER): Payer: BC Managed Care – PPO | Admitting: Family Medicine

## 2019-08-15 ENCOUNTER — Encounter: Payer: Self-pay | Admitting: Family Medicine

## 2019-08-15 DIAGNOSIS — G43009 Migraine without aura, not intractable, without status migrainosus: Secondary | ICD-10-CM

## 2019-08-15 DIAGNOSIS — J301 Allergic rhinitis due to pollen: Secondary | ICD-10-CM

## 2019-08-15 DIAGNOSIS — J309 Allergic rhinitis, unspecified: Secondary | ICD-10-CM | POA: Insufficient documentation

## 2019-08-15 MED ORDER — AMITRIPTYLINE HCL 25 MG PO TABS: ORAL_TABLET | ORAL | 2 refills | Status: DC

## 2019-08-15 MED ORDER — SUMATRIPTAN SUCCINATE 50 MG PO TABS: ORAL_TABLET | ORAL | 12 refills | Status: DC

## 2019-08-15 NOTE — Assessment & Plan Note (Signed)
She is doing well with current medications.  Rx renewed for elavil and imitrex.

## 2019-08-15 NOTE — Assessment & Plan Note (Signed)
Ok to increase cetirizine to BID Continue flonase.  Increase fluid intake.  Recommend call or message for development to any new or worsening symptoms.

## 2019-08-15 NOTE — Progress Notes (Signed)
Establishing for medication refills.  Having really bad allergies with throat scratching: started yesterday afternoon after making her appointment.

## 2019-08-15 NOTE — Progress Notes (Signed)
Carrie Gibson - 44 y.o. female MRN [id number]  Date of birth: 1975-08-29   This visit type was conducted due to national recommendations for restrictions regarding the COVID-19 Pandemic (e.g. social distancing).  This format is felt to be most appropriate for this patient at this time.  All issues noted in this document were discussed and addressed.  No physical exam was performed (except for noted visual exam findings with Video Visits).  I discussed the limitations of evaluation and management by telemedicine and the availability of in person appointments. The patient expressed understanding and agreed to proceed.  I connected with@ on 08/15/19 at  4:00 PM EDT by a video enabled telemedicine application and verified that I am speaking with the correct person using two identifiers.  Present at visit: Luetta Nutting, DO Charlene Brooke   Patient Location: Home [address] [place] [address]   Provider location:   [place]  No chief complaint on file.   HPI  Carrie Gibson is a 44 y.o. female who presents via audio/video conferencing for a telehealth visit today.  She is following up today for migraines.  Current management with elavil 25mg  qhs and sumatriptan 50mg  as needed.  Current medications are working well for her.  Has breakthrough migraines a few times per month that improves with imitrex.  No significant side effects from medication.   She also reports worsening allergies over the past few days.  Has increased congestion and scratchy throat.  Denies fever, chills, headache, nausea, vomiting, diarrhea, shortness of breath or wheezing.  She did have COVID vaccine.    ROS:  A comprehensive ROS was completed and negative except as noted per HPI  Past Medical History:  Diagnosis Date  . Asthma   . Environmental allergies   . Morbid obesity ([place])     Past Surgical History:  Procedure Laterality Date  . CESAREAN SECTION N/A 01/11/2015   Procedure: CESAREAN SECTION;  Surgeon: Emily Filbert, MD;  Location: [facility];  Service: Obstetrics;  Laterality: N/A;  . FOOT SURGERY  1995    Family History  Problem Relation Age of Onset  . Fibroids Mother   . Mental illness Father   . Fibroids Sister     Social History   Socioeconomic History  . Marital status: Married    Spouse name: Not on file  . Number of children: Not on file  . Years of education: Not on file  . Highest education level: Not on file  Occupational History  . Not on file  Tobacco Use  . Smoking status: Former Research scientist (life sciences)  . Smokeless tobacco: Never Used  Substance and Sexual Activity  . Alcohol use: No    Alcohol/week: 0.0 standard drinks  . Drug use: No  . Sexual activity: Yes    Partners: Male  Other Topics Concern  . Not on file  Social History Narrative  . Not on file   Social Determinants of Health   Financial Resource Strain:   . Difficulty of Paying Living Expenses:   Food Insecurity:   . Worried About Charity fundraiser in the Last Year:   . Arboriculturist in the Last Year:   Transportation Needs:   . Film/video editor (Medical):   Marland Kitchen Lack of Transportation (Non-Medical):   Physical Activity:   . Days of Exercise per Week:   . Minutes of Exercise per Session:   Stress:   . Feeling of Stress :   Social Connections:   .  Frequency of Communication with Friends and Family:   . Frequency of Social Gatherings with Friends and Family:   . Attends Religious Services:   . Active Member of Clubs or Organizations:   . Attends Archivist Meetings:   Marland Kitchen Marital Status:   Intimate Partner Violence:   . Fear of Current or Ex-Partner:   . Emotionally Abused:   Marland Kitchen Physically Abused:   . Sexually Abused:      Current Outpatient Medications:  .  albuterol (VENTOLIN HFA) 108 (90 Base) MCG/ACT inhaler, Inhale 1-2 puffs into the lungs every 6 (six) hours as needed for wheezing or shortness of breath., Disp: 8.5 g, Rfl: 1 .  amitriptyline (ELAVIL) 25 MG tablet, TAKE 1/2 TO 1 TABLET  BY MOUTH EVERYDAY AT BEDTIME, Disp: 90 tablet, Rfl: 1 .  cetirizine (ZYRTEC) 10 MG tablet, Take 10 mg by mouth daily., Disp: , Rfl:  .  fluticasone (FLONASE) 50 MCG/ACT nasal spray, SPRAY 2 SPRAYS INTO EACH NOSTRIL EVERY DAY, Disp: 48 mL, Rfl: PRN .  omeprazole (PRILOSEC) 40 MG capsule, TAKE 1 CAPSULE (40 MG TOTAL) BY MOUTH DAILY., Disp: 90 capsule, Rfl: 3 .  SUMAtriptan (IMITREX) 50 MG tablet, TAKE 1 TAB BY MOUTH EVERY 2 HOURS AS NEEDED FOR MIGRAINE. MAY REPEAT IN 2 HOURS IF HEADACHE PERSISTS, Disp: 30 tablet, Rfl: 12 .  cyclobenzaprine (FLEXERIL) 10 MG tablet, Take 0.5-1 tablets (5-10 mg total) by mouth 3 (three) times daily as needed for muscle spasms. (Patient not taking: Reported on 08/15/2019), Disp: 60 tablet, Rfl: 1  Current Facility-Administered Medications:  .  albuterol (PROVENTIL) (2.5 MG/3ML) 0.083% nebulizer solution 2.5 mg, 2.5 mg, Nebulization, Q4H, Cummings, [place], PA-C, 2.5 mg at 04/13/17 1451  EXAM:  VITALS per patient if applicable: Wt 253 lb (114.8 kg)   LMP 07/30/2019 (Exact Date)   BMI 39.63 kg/m   GENERAL: alert, oriented, appears well and in no acute distress  HEENT: atraumatic, conjunttiva clear, no obvious abnormalities on inspection of external nose and ears  NECK: normal movements of the head and neck  LUNGS: on inspection no signs of respiratory distress, breathing rate appears normal, no obvious gross SOB, gasping or wheezing  CV: no obvious cyanosis  MS: moves all visible extremities without noticeable abnormality  PSYCH/NEURO: pleasant and cooperative, no obvious depression or anxiety, speech and thought processing grossly intact  ASSESSMENT AND PLAN:  Discussed the following assessment and plan:  Migraine headache She is doing well with current medications.  Rx renewed for elavil and imitrex.   Allergic rhinitis Ok to increase cetirizine to BID Continue flonase.  Increase fluid intake.  Recommend call or message for development to  any new or worsening symptoms.      I discussed the assessment and treatment plan with the patient. The patient was provided an opportunity to ask questions and all were answered. The patient agreed with the plan and demonstrated an understanding of the instructions.   The patient was advised to call back or seek an in-person evaluation if the symptoms worsen or if the condition fails to improve as anticipated.    Luetta Nutting, DO

## 2019-08-21 ENCOUNTER — Encounter: Payer: Self-pay | Admitting: Family Medicine

## 2019-09-15 ENCOUNTER — Other Ambulatory Visit: Payer: Self-pay | Admitting: Family Medicine

## 2019-12-28 ENCOUNTER — Other Ambulatory Visit: Payer: Self-pay

## 2019-12-28 MED ORDER — FLUTICASONE PROPIONATE 50 MCG/ACT NA SUSP: NASAL | 99 refills | Status: DC

## 2020-01-14 ENCOUNTER — Other Ambulatory Visit: Payer: Self-pay

## 2020-01-19 ENCOUNTER — Other Ambulatory Visit: Payer: Self-pay | Admitting: Family Medicine

## 2020-01-22 NOTE — Telephone Encounter (Signed)
Last prescribed by Dr. Georgina Snell in 10/2018.  Medication has been discontinued on current medication list.  Please review for appropriateness of refill.  Charyl Bigger, CMA

## 2020-02-06 ENCOUNTER — Other Ambulatory Visit: Payer: Self-pay | Admitting: Physician Assistant

## 2020-05-01 ENCOUNTER — Other Ambulatory Visit: Payer: Self-pay | Admitting: Family Medicine

## 2020-05-10 ENCOUNTER — Other Ambulatory Visit: Payer: Self-pay | Admitting: Family Medicine

## 2020-05-26 ENCOUNTER — Encounter: Payer: Self-pay | Admitting: Family Medicine

## 2020-05-26 ENCOUNTER — Other Ambulatory Visit: Payer: Self-pay

## 2020-05-26 ENCOUNTER — Ambulatory Visit (INDEPENDENT_AMBULATORY_CARE_PROVIDER_SITE_OTHER): Payer: BC Managed Care – PPO | Admitting: Family Medicine

## 2020-05-26 VITALS — BP 138/82 | HR 89 | Wt 260.4 lb

## 2020-05-26 DIAGNOSIS — S9782XS Crushing injury of left foot, sequela: Secondary | ICD-10-CM

## 2020-05-26 DIAGNOSIS — M79672 Pain in left foot: Secondary | ICD-10-CM

## 2020-05-26 DIAGNOSIS — J4540 Moderate persistent asthma, uncomplicated: Secondary | ICD-10-CM | POA: Diagnosis not present

## 2020-05-26 DIAGNOSIS — G43009 Migraine without aura, not intractable, without status migrainosus: Secondary | ICD-10-CM

## 2020-05-26 MED ORDER — NURTEC 75 MG PO TBDP: ORAL_TABLET | ORAL | 1 refills | Status: DC

## 2020-05-26 MED ORDER — ALBUTEROL SULFATE HFA 108 (90 BASE) MCG/ACT IN AERS
1.0000 | INHALATION_SPRAY | Freq: Four times a day (QID) | RESPIRATORY_TRACT | 1 refills | Status: DC | PRN
Start: 2020-05-26 — End: 2020-11-03

## 2020-05-26 NOTE — Patient Instructions (Signed)
Try nurtec every other day for migraine prevention.  See me in about 6-8 weeks after starting this.  I placed referral to orthopedics for your foot.

## 2020-05-27 ENCOUNTER — Other Ambulatory Visit: Payer: Self-pay | Admitting: Family Medicine

## 2020-05-28 NOTE — Assessment & Plan Note (Signed)
Elavil is not as effective for migraine prevention for her.   Discussed alternatives.  Unable to to tolerate topiramate previously.  Will try nurtec 75mg  every other day.  She may continue imitrex as needed for now.

## 2020-05-28 NOTE — Progress Notes (Signed)
Carrie Gibson - 45 y.o. female MRN [id number]  Date of birth: 1975-06-14  Subjective Chief Complaint  Patient presents with  . Migraine    HPI Carrie Gibson is a 45 y.o. female with history of migraines and asthma here today for follow up visit.    Reports that migraines have increased in frequency.  She has been taking elavil 25mg  nightly but doesn't feel like this has been as effective for migraine prevention.  Imitrex does help but is needing more frequently.    Asthma remains well controlled with daily qvar.  Uses albuterol occasionally.   ROS:  A comprehensive ROS was completed and negative except as noted per HPI  Allergies  Allergen Reactions  . Topamax [Topiramate] Other (See Comments)    Bad paresthesia     Past Medical History:  Diagnosis Date  . Asthma   . Environmental allergies   . Morbid obesity ([place])     Past Surgical History:  Procedure Laterality Date  . CESAREAN SECTION N/A 01/11/2015   Procedure: CESAREAN SECTION;  Surgeon: Emily Filbert, MD;  Location: [facility];  Service: Obstetrics;  Laterality: N/A;  . FOOT SURGERY  1995    Social History   Socioeconomic History  . Marital status: Married    Spouse name: Not on file  . Number of children: Not on file  . Years of education: Not on file  . Highest education level: Not on file  Occupational History  . Not on file  Tobacco Use  . Smoking status: Former Research scientist (life sciences)  . Smokeless tobacco: Never Used  Vaping Use  . Vaping Use: Never used  Substance and Sexual Activity  . Alcohol use: No    Alcohol/week: 0.0 standard drinks  . Drug use: No  . Sexual activity: Yes    Partners: Male  Other Topics Concern  . Not on file  Social History Narrative  . Not on file   Social Determinants of Health   Financial Resource Strain: Not on file  Food Insecurity: Not on file  Transportation Needs: Not on file  Physical Activity: Not on file  Stress: Not on file  Social Connections: Not on file    Family History   Problem Relation Age of Onset  . Fibroids Mother   . Mental illness Father   . Fibroids Sister     Health Maintenance  Topic Date Due  . Hepatitis C Screening  Never done  . PAP SMEAR-Modifier  06/08/2019  . TETANUS/TDAP  10/27/2024  . INFLUENZA VACCINE  Completed  . COVID-19 Vaccine  Completed  . HIV Screening  Completed     ----------------------------------------------------------------------------------------------------------------------------------------------------------------------------------------------------------------- Physical Exam BP 138/82 (BP Location: Right Arm, Patient Position: Sitting, Cuff Size: Normal)   Pulse 89   Wt 260 lb 6.4 oz (118.1 kg)   SpO2 98%   BMI 40.78 kg/m   Physical Exam Constitutional:      Appearance: Normal appearance.  HENT:     Head: Atraumatic.  Eyes:     General: No scleral icterus. Cardiovascular:     Rate and Rhythm: Normal rate and regular rhythm.  Pulmonary:     Effort: Pulmonary effort is normal.     Breath sounds: Normal breath sounds.  Neurological:     General: No focal deficit present.     Mental Status: She is alert.  Psychiatric:        Mood and Affect: Mood normal.        Behavior: Behavior normal.     -------------------------------------------------------------------------------------------------------------------------------------------------------------------------------------------------------------------  Assessment and Plan  Migraine headache Elavil is not as effective for migraine prevention for her.   Discussed alternatives.  Unable to to tolerate topiramate previously.  Will try nurtec 75mg  every other day.  She may continue imitrex as needed for now.   Asthma Well controlled with daily qvar for maintenance.     Meds ordered this encounter  Medications  . Rimegepant Sulfate (NURTEC) 75 MG TBDP    Sig: Take 1 tab every other day for prevention of migraine.    Dispense:  45 tablet     Refill:  1  . albuterol (VENTOLIN HFA) 108 (90 Base) MCG/ACT inhaler    Sig: Inhale 1-2 puffs into the lungs every 6 (six) hours as needed for wheezing or shortness of breath.    Dispense:  18 g    Refill:  1    No follow-ups on file.    This visit occurred during the SARS-CoV-2 public health emergency.  Safety protocols were in place, including screening questions prior to the visit, additional usage of staff PPE, and extensive cleaning of exam room while observing appropriate contact time as indicated for disinfecting solutions.

## 2020-05-28 NOTE — Assessment & Plan Note (Signed)
Well controlled with daily qvar for maintenance.

## 2020-06-10 ENCOUNTER — Emergency Department
Admission: EM | Admit: 2020-06-10 | Discharge: 2020-06-10 | Disposition: A | Discharge status: Home / Self Care | Payer: BC Managed Care – PPO | Source: Home / Self Care

## 2020-06-10 ENCOUNTER — Emergency Department (INDEPENDENT_AMBULATORY_CARE_PROVIDER_SITE_OTHER): Payer: BC Managed Care – PPO

## 2020-06-10 ENCOUNTER — Other Ambulatory Visit: Payer: Self-pay

## 2020-06-10 DIAGNOSIS — M79672 Pain in left foot: Secondary | ICD-10-CM

## 2020-06-10 DIAGNOSIS — W19XXXA Unspecified fall, initial encounter: Secondary | ICD-10-CM

## 2020-06-10 DIAGNOSIS — M19072 Primary osteoarthritis, left ankle and foot: Secondary | ICD-10-CM

## 2020-06-10 DIAGNOSIS — M7732 Calcaneal spur, left foot: Secondary | ICD-10-CM

## 2020-06-10 MED ORDER — PREDNISONE 20 MG PO TABS: 40.0000 mg | ORAL_TABLET | Freq: Every day | ORAL | 0 refills | Status: DC

## 2020-06-10 NOTE — Discharge Instructions (Signed)
Keep follow-up with orthopedics. Start prednisone 40 mg once daily x 5. Use gel inserts with prolonged standing.

## 2020-06-10 NOTE — ED Triage Notes (Signed)
Patient presents to Urgent Care with complaints of sharp left foot pain since about 4 days ago. Patient reports she almost fell over the weekend and caught herself w/ the left foot and thinks she might have twisted or sprained something in the process. Pt states it is the lateral aspect of the foot that is painful w/ pressure or motion.

## 2020-06-10 NOTE — ED Provider Notes (Signed)
[facility]    CSN: [id number] Arrival date & time: 06/10/20  0805      History   Chief Complaint Chief Complaint  Patient presents with  . Foot Pain    Left    HPI Carrie Gibson is a 45 y.o. female.   HPI  Patient presents today with a complaint of left foot pain.  Patient has had recurrent issues involving the left great toe due to a previous repair.  Of concern today patient is complaining of lateral left foot pain which has worsened due to a trip and fall over the weekend in which she landed onto her left foot. She has pain with weightbearing.  She has taken ibuprofen with minimal relief.  Past Medical History:  Diagnosis Date  . Asthma   . Environmental allergies   . Morbid obesity [facility])     Patient Active Problem List   Diagnosis Date Noted  . Allergic rhinitis 08/15/2019  . Lumbar strain 02/15/2017  . Headache 04/22/2016  . Migraine headache 04/22/2016  . GERD (gastroesophageal reflux disease) 02/18/2016  . Toenail avulsion, initial encounter 02/18/2016  . Morbid obesity ([place])   . S/P cesarean section 06/07/2014  . Asthma 04/13/2012  . Infertility, female 01/17/2012  . History of miscarriage 01/10/2012    Past Surgical History:  Procedure Laterality Date  . CESAREAN SECTION N/A 01/11/2015   Procedure: CESAREAN SECTION;  Surgeon: Emily Filbert, MD;  Location: [facility];  Service: Obstetrics;  Laterality: N/A;  . FOOT SURGERY  1995    OB History    Gravida  1   Para  1   Term  1   Preterm      AB      Living  1     SAB      IAB      Ectopic      Multiple  0   Live Births  1            Home Medications    Prior to Admission medications   Medication Sig Start Date End Date Taking? Authorizing Provider  predniSONE (DELTASONE) 20 MG tablet Take 2 tablets (40 mg total) by mouth daily with breakfast. 06/10/20  Yes Scot Jun, FNP  albuterol (VENTOLIN HFA) 108 (90 Base) MCG/ACT inhaler Inhale 1-2 puffs into the lungs every 6  (six) hours as needed for wheezing or shortness of breath. 05/26/20   Luetta Nutting, DO  cetirizine (ZYRTEC) 10 MG tablet Take 10 mg by mouth daily.    [provider]  cyclobenzaprine (FLEXERIL) 10 MG tablet Take 0.5-1 tablets (5-10 mg total) by mouth 3 (three) times daily as needed for muscle spasms. 08/07/18   Gregor Hams, MD  fluticasone Asencion Islam) 50 MCG/ACT nasal spray 2 pumps daily 12/28/19   Luetta Nutting, DO  omeprazole (PRILOSEC) 40 MG capsule TAKE 1 CAPSULE BY MOUTH EVERY DAY 05/28/20   Luetta Nutting, DO  QVAR REDIHALER 80 MCG/ACT inhaler TAKE 2 PUFFS BY MOUTH TWICE A DAY 01/22/20   Luetta Nutting, DO  Rimegepant Sulfate (NURTEC) 75 MG TBDP Take 1 tab every other day for prevention of migraine. 05/26/20   Luetta Nutting, DO  SUMAtriptan (IMITREX) 50 MG tablet TAKE 1 TAB BY MOUTH EVERY 2 HOURS AS NEEDED FOR MIGRAINE. MAY REPEAT IN 2 HOURS IF HEADACHE PERSISTS 08/15/19   Luetta Nutting, DO    Family History Family History  Problem Relation Age of Onset  . Fibroids Mother   . Mental illness  Father   . Fibroids Sister     Social History Social History   Tobacco Use  . Smoking status: Former Research scientist (life sciences)  . Smokeless tobacco: Never Used  Vaping Use  . Vaping Use: Never used  Substance Use Topics  . Alcohol use: No    Alcohol/week: 0.0 standard drinks  . Drug use: No     Allergies   Topamax [topiramate]   Review of Systems Review of Systems Pertinent negatives listed in HPI   Physical Exam Triage Vital Signs ED Triage Vitals  Enc Vitals Group     BP 06/10/20 0820 133/85     Pulse Rate 06/10/20 0820 75     Resp 06/10/20 0820 15     Temp 06/10/20 0820 98.4 F (36.9 C)     Temp Source 06/10/20 0820 Oral     SpO2 06/10/20 0820 96 %     Weight --      Height --      Head Circumference --      Peak Flow --      Pain Score 06/10/20 0819 3     Pain Loc --      Pain Edu? --      Excl. in [place]? --    No data found.  Updated Vital Signs BP 133/85 (BP Location:  Right Arm)   Pulse 75   Temp 98.4 F (36.9 C) (Oral)   Resp 15   SpO2 96%   Breastfeeding No   Visual Acuity Right Eye Distance:   Left Eye Distance:   Bilateral Distance:    Right Eye Near:   Left Eye Near:    Bilateral Near:     Physical Exam General appearance: alert, obese,cooperative and in no distress Head: Normocephalic, without obvious abnormality, atraumatic Respiratory: Respirations even and unlabored, normal respiratory rate Heart: Rate and rhythm normal.  Extremities: Left foot: no gross deformities or swelling present  (See imaging) Skin: Skin color, texture, turgor normal. No rashes seen  Psych: Appropriate mood and affect. Neurologic: GCS 15, normal coordination, normal gait UC Treatments / Results  Labs (all labs ordered are listed, but only abnormal results are displayed) Labs Reviewed - No data to display  EKG   Radiology DG Foot Complete Left  Result Date: 06/10/2020 CLINICAL DATA:  Trip and fall several days ago with foot pain, initial encounter EXAM: LEFT FOOT - COMPLETE 3+ VIEW COMPARISON:  None. FINDINGS: Postsurgical changes are noted the base of the first metatarsal. Mild degenerative changes of the first MTP joint are seen. No acute fracture or dislocation is noted. No soft tissue abnormality is noted. Tarsal degenerative change and calcaneal spurring are seen. IMPRESSION: Degenerative and postoperative changes without acute abnormality. Electronically Signed   By: Inez Catalina M.D.   On: 06/10/2020 08:52    Procedures Procedures (including critical care time)  Medications Ordered in UC Medications - No data to display  Initial Impression / Assessment and Plan / UC Course  I have reviewed the triage vital signs and the nursing notes.  Pertinent labs & imaging results that were available during my care of the patient were reviewed by me and considered in my medical decision making (see chart for details).    Imaging significant for only  chronic left foot specifically the talus bones in the calcaneal bone spurring present per imaging.  Treating foot pain today is inflammatory arthritic exacerbation.  Treat with prednisone 40 mg once daily for total of 5 days.  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  Patient scheduled for follow-up with orthopedics in 1 month advised to keep appointment for additional management and ongoing management of arthritic changes noted in foot.  Patient verbalized understanding and agreement with today's plan. Final Clinical Impressions(s) / UC Diagnoses   Final diagnoses:  Foot pain, left  Osteoarthritis of left foot, unspecified osteoarthritis type  Calcaneal spur of foot, left     Discharge Instructions     Keep follow-up with orthopedics. Start prednisone 40 mg once daily x 5. Use gel inserts with prolonged standing.    ED Prescriptions    Medication Sig Dispense Auth. Provider   predniSONE (DELTASONE) 20 MG tablet Take 2 tablets (40 mg total) by mouth daily with breakfast. 10 tablet Scot Jun, FNP     PDMP not reviewed this encounter.   Scot Jun, [place] 06/10/20 [phone]

## 2020-06-28 ENCOUNTER — Encounter: Payer: Self-pay | Admitting: Family Medicine

## 2020-07-14 ENCOUNTER — Ambulatory Visit: Payer: BC Managed Care – PPO | Admitting: Family Medicine

## 2020-07-16 ENCOUNTER — Ambulatory Visit: Payer: BC Managed Care – PPO | Admitting: Family Medicine

## 2020-07-16 ENCOUNTER — Other Ambulatory Visit: Payer: Self-pay

## 2020-07-16 ENCOUNTER — Encounter: Payer: Self-pay | Admitting: Family Medicine

## 2020-07-16 VITALS — BP 122/76 | HR 80 | Temp 98.0°F | Ht 67.0 in | Wt 268.6 lb

## 2020-07-16 DIAGNOSIS — G43009 Migraine without aura, not intractable, without status migrainosus: Secondary | ICD-10-CM | POA: Diagnosis not present

## 2020-07-16 MED ORDER — KETOROLAC TROMETHAMINE 60 MG/2ML IM SOLN
60.0000 mg | Freq: Once | INTRAMUSCULAR | Status: AC
  Administered 2020-07-16: 60 mg via INTRAMUSCULAR

## 2020-07-16 MED ORDER — ONDANSETRON HCL 4 MG PO TABS
4.0000 mg | ORAL_TABLET | Freq: Once | ORAL | Status: AC
  Administered 2020-07-16: 4 mg via ORAL

## 2020-07-16 MED ORDER — PROPRANOLOL HCL ER 60 MG PO CP24: 60.0000 mg | ORAL_CAPSULE | Freq: Every day | ORAL | 1 refills | Status: DC

## 2020-07-16 NOTE — Patient Instructions (Signed)
Start propranolol 60mg  daily.  Let me know if migraines are not improving with this after 7-10 days.  You may continue to use imitrex with this as needed.      Chronic Migraine Headache A migraine headache is throbbing pain that is usually on one side of the head. Migraines that keep coming back are called recurring migraines. A migraine is called a chronic migraine if it happens at least 15 days in a month for more than 3 months. Talk with your doctor about what things may bring on (trigger) your migraines. What are the causes? The exact cause of this condition is not known. A migraine may be caused when nerves in the brain become irritated and release chemicals that cause irritation and swelling (inflammation) of blood vessels. The irritation and swelling of the blood vessels causes pain. Migraines may be brought on or caused by:  Smoking.  Foods and drinks, such as: ? Cheese. ? Chocolate. ? Alcohol. ? Caffeine.  Certain substances in some foods or drinks.  Some medicines. Other things that may bring on a migraine include:  Periods, for women.  Stress.  Not enough sleep or too much sleep.  Feeling very tired.  Bright lights or loud noises.  Smells  Weather changes and being at high altitude. What increases the risk? The following factors may make you more likely to have chronic migraine:  Having migraines or family members who have them.  Being very sad (depressed) or feeling worried or nervous (anxious).  Taking a lot of pain medicine.  Having problems sleeping.  Having heart disease, diabetes, or being very overweight (obese). What are the signs or symptoms? Symptoms of this condition include:  Pain that feels like it throbs.  Pain that is usually only on one side of the head. In some cases, the pain may be on both sides of the head or around the head or neck.  Very bad pain that keeps you from doing daily activities.  Pain that gets worse with  activity.  Feeling like you may vomit (feeling nauseous) or vomiting.  Pain when you are around bright lights, loud noises, or activity.  Being sensitive to bright lights, loud noises, or smells.  Feeling dizzy. How is this treated? This condition is treated with:  Medicines. These help to: ? Lessen pain and the feeling like you may vomit. ? Prevent migraines.  Changes to your diet or sleep.  Therapy. This might include: ? Relaxation training. ? Biofeedback. This is a treatment that teaches you to relax, use your brain to lower your heart rate, and control your breathing. ? Cognitive behavioral therapy (CBT). This therapy helps you set goals and follow up on the changes that you make.  Acupuncture.  Using a device that provides electrical stimulation to your nerves, which can help take away pain.  Surgery, if the other treatments do not work. Follow these instructions at home: Medicines  Take over-the-counter and prescription medicines only as told by your doctor.  Ask your doctor if the medicine prescribed to you requires you to avoid driving or using machinery. Lifestyle  Do not use any products that contain nicotine or tobacco, such as cigarettes, e-cigarettes, and chewing tobacco. If you need help quitting, ask your doctor.  Do not drink alcohol.  Get 7-9 hours of sleep each night.  Lower the stress in your life. Ask your doctor about ways to do this.  Stay at a healthy weight. Talk with your doctor if you need help losing  weight.  Get regular exercise.   General instructions  Keep a journal to find out if certain things bring on migraines. For example, write down: ? What you eat and drink. ? How much sleep you get. ? Any change to your diet or medicines.  Lie down in a dark, quiet room when you have a migraine.  Try placing a cool towel over your head when you have a migraine.  Keep lights dim if bright lights bother you or make your migraines  worse.  Keep all follow-up visits as told by your doctor. This is important.   Where to find more information  Coalition for Headache and Migraine Patients (CHAMP): headachemigraine.org  American Migraine Foundation: americanmigrainefoundation.org  National Headache Foundation: headaches.org Contact a doctor if:  Medicine does not help your migraine.  Your pain keeps coming back. Get help right away if:  Your migraine becomes really bad and medicine does not help.  You have a stiff neck and fever.  You have trouble seeing.  Your muscles are weak or you lose control of them.  You lose your balance or have trouble walking.  You feel like you will faint or you faint.  You start having sudden, very bad headaches.  You have a seizure. Summary  A migraine headache is very bad, throbbing pain that is usually on one side of the head.  A chronic migraine is a migraine that happens 15 days in a month for more than 3 months.  Talk with your doctor about what things may bring on your migraines.  Lie down in a dark, quiet room when you have a migraine.  Keep a journal. This can help you find out if certain things make you have migraines. This information is not intended to replace advice given to you by your health care provider. Make sure you discuss any questions you have with your health care provider. Document Revised: 05/16/2019 Document Reviewed: 05/16/2019 Elsevier Patient Education  [place].

## 2020-07-16 NOTE — Assessment & Plan Note (Signed)
Migraines remain fairly uncontrolled.  We discussed trying propranolol for migraine prophylaxis with her increased frequency of migraines.  She may continue Imitrex as needed.  She was given injection of Toradol 60 mg as well as 4 mg oral Zofran today for her acute migraine.

## 2020-07-16 NOTE — Progress Notes (Signed)
Carrie Gibson - 45 y.o. female MRN [id number]  Date of birth: 1975-07-27  Subjective Chief Complaint  Patient presents with  . Migraine    HPI Carrie Gibson is a 45 y.o. female here today for follow-up of migraines.  She had previously been on amitriptyline for migraine prevention as well as Imitrex as needed.  She felt like amitriptyline was not working as well so we changed to [employer].  Unfortunately she felt like she was having more migraines with this this was completely ineffective.  She has since restarted her amitriptyline however she continues to have breakthrough migraines.  She did not tolerate topiramate previously.  She does not recall trying propranolol in the past.  She is currently experiencing a migraine that she has had for the past couple of hours.  She is having nausea and light sensitivity associated with this.  ROS:  A comprehensive ROS was completed and negative except as noted per HPI  Allergies  Allergen Reactions  . Topamax [Topiramate] Other (See Comments)    Bad paresthesia     Past Medical History:  Diagnosis Date  . Asthma   . Environmental allergies   . Morbid obesity ([place])     Past Surgical History:  Procedure Laterality Date  . CESAREAN SECTION N/A 01/11/2015   Procedure: CESAREAN SECTION;  Surgeon: Emily Filbert, MD;  Location: [facility];  Service: Obstetrics;  Laterality: N/A;  . FOOT SURGERY  1995    Social History   Socioeconomic History  . Marital status: Married    Spouse name: Not on file  . Number of children: Not on file  . Years of education: Not on file  . Highest education level: Not on file  Occupational History  . Not on file  Tobacco Use  . Smoking status: Former Research scientist (life sciences)  . Smokeless tobacco: Never Used  Vaping Use  . Vaping Use: Never used  Substance and Sexual Activity  . Alcohol use: No    Alcohol/week: 0.0 standard drinks  . Drug use: No  . Sexual activity: Yes    Partners: Male  Other Topics Concern  . Not on file  Social  History Narrative  . Not on file   Social Determinants of Health   Financial Resource Strain: Not on file  Food Insecurity: Not on file  Transportation Needs: Not on file  Physical Activity: Not on file  Stress: Not on file  Social Connections: Not on file    Family History  Problem Relation Age of Onset  . Fibroids Mother   . Mental illness Father   . Fibroids Sister     Health Maintenance  Topic Date Due  . Hepatitis C Screening  Never done  . PAP SMEAR-Modifier  06/08/2019  . INFLUENZA VACCINE  11/10/2020  . TETANUS/TDAP  10/27/2024  . COVID-19 Vaccine  Completed  . HIV Screening  Completed  . HPV VACCINES  Aged Out     ----------------------------------------------------------------------------------------------------------------------------------------------------------------------------------------------------------------- Physical Exam BP 122/76 (BP Location: Left Arm, Patient Position: Sitting, Cuff Size: Large)   Pulse 80   Temp 98 F (36.7 C)   Ht 5\' 7"  (1.702 m)   Wt 268 lb 9.6 oz (121.8 kg)   SpO2 (!) 80%   BMI 42.07 kg/m   Physical Exam Constitutional:      Comments: Sitting in darkened room, appears somewhat uncomfortable.  HENT:     Head: Normocephalic and atraumatic.  Cardiovascular:     Rate and Rhythm: Normal rate and regular rhythm.  Pulmonary:  Effort: Pulmonary effort is normal.     Breath sounds: Normal breath sounds.  Neurological:     General: No focal deficit present.     Mental Status: She is alert.  Psychiatric:        Mood and Affect: Mood normal.        Behavior: Behavior normal.     ------------------------------------------------------------------------------------------------------------------------------------------------------------------------------------------------------------------- Assessment and Plan  Migraine headache Migraines remain fairly uncontrolled.  We discussed trying propranolol for migraine  prophylaxis with her increased frequency of migraines.  She may continue Imitrex as needed.  She was given injection of Toradol 60 mg as well as 4 mg oral Zofran today for her acute migraine.     Meds ordered this encounter  Medications  . propranolol ER (INDERAL LA) 60 MG 24 hr capsule    Sig: Take 1 capsule (60 mg total) by mouth daily.    Dispense:  30 capsule    Refill:  1  . ketorolac (TORADOL) injection 60 mg  . ondansetron (ZOFRAN) tablet 4 mg    Return in about 3 months (around 10/15/2020) for migraines.    This visit occurred during the SARS-CoV-2 public health emergency.  Safety protocols were in place, including screening questions prior to the visit, additional usage of staff PPE, and extensive cleaning of exam room while observing appropriate contact time as indicated for disinfecting solutions.

## 2020-07-29 ENCOUNTER — Ambulatory Visit: Payer: BC Managed Care – PPO | Admitting: Family Medicine

## 2020-08-08 ENCOUNTER — Other Ambulatory Visit: Payer: Self-pay | Admitting: Family Medicine

## 2020-08-23 ENCOUNTER — Other Ambulatory Visit: Payer: Self-pay | Admitting: Family Medicine

## 2020-08-31 ENCOUNTER — Other Ambulatory Visit: Payer: Self-pay | Admitting: Family Medicine

## 2020-10-04 ENCOUNTER — Other Ambulatory Visit: Payer: Self-pay | Admitting: Family Medicine

## 2020-10-15 ENCOUNTER — Other Ambulatory Visit: Payer: Self-pay

## 2020-10-15 ENCOUNTER — Encounter: Payer: Self-pay | Admitting: Family Medicine

## 2020-10-15 ENCOUNTER — Ambulatory Visit: Payer: BC Managed Care – PPO | Admitting: Family Medicine

## 2020-10-15 DIAGNOSIS — G43009 Migraine without aura, not intractable, without status migrainosus: Secondary | ICD-10-CM | POA: Diagnosis not present

## 2020-10-15 NOTE — Assessment & Plan Note (Signed)
The addition of propranolol has been helpful for her.  We will continue at current dose.  She will continue Imitrex as needed for breakthrough migraines.

## 2020-10-15 NOTE — Progress Notes (Signed)
Carrie Gibson - 45 y.o. female MRN [id number]  Date of birth: [birth year]  Subjective No chief complaint on file.   HPI Carrie Gibson is a 45 year old female here today for follow-up of migraines.  Propranolol was added at previous appointment for migraine prophylaxis.  She reports that she has had decrease in migraines since starting this.  She has not had any significant side effects with this including fatigue or dizziness.  She does continue Imitrex as needed for breakthrough migraines.  ROS:  A comprehensive ROS was completed and negative except as noted per HPI  Allergies  Allergen Reactions   Topamax [Topiramate] Other (See Comments)    Bad paresthesia     Past Medical History:  Diagnosis Date   Asthma    Environmental allergies    Morbid obesity ([place])     Past Surgical History:  Procedure Laterality Date   CESAREAN SECTION N/A 01/11/2015   Procedure: CESAREAN SECTION;  Surgeon: Emily Filbert, MD;  Location: [facility];  Service: Obstetrics;  Laterality: N/A;   FOOT SURGERY  1995    Social History   Socioeconomic History   Marital status: Married    Spouse name: Not on file   Number of children: Not on file   Years of education: Not on file   Highest education level: Not on file  Occupational History   Not on file  Tobacco Use   Smoking status: Former    Pack years: 0.00   Smokeless tobacco: Never  Vaping Use   Vaping Use: Never used  Substance and Sexual Activity   Alcohol use: No    Alcohol/week: 0.0 standard drinks   Drug use: No   Sexual activity: Yes    Partners: Male  Other Topics Concern   Not on file  Social History Narrative   Not on file   Social Determinants of Health   Financial Resource Strain: Not on file  Food Insecurity: Not on file  Transportation Needs: Not on file  Physical Activity: Not on file  Stress: Not on file  Social Connections: Not on file    Family History  Problem Relation Age of Onset   Fibroids Mother    Mental illness Father     Fibroids Sister     Health Maintenance  Topic Date Due   Hepatitis C Screening  Never done   PAP SMEAR-Modifier  06/08/2019   INFLUENZA VACCINE  11/10/2020   TETANUS/TDAP  10/27/2024   COVID-19 Vaccine  Completed   HIV Screening  Completed   Pneumococcal Vaccine 41-25 Years old  Aged Out   HPV VACCINES  Aged Out     ----------------------------------------------------------------------------------------------------------------------------------------------------------------------------------------------------------------- Physical Exam BP 136/83   Pulse 75   Ht 5\' 7"  (1.702 m)   Wt 266 lb (120.7 kg)   BMI 41.66 kg/m   Physical Exam Constitutional:      Appearance: Normal appearance.  HENT:     Head: Normocephalic and atraumatic.  Eyes:     General: No scleral icterus. Cardiovascular:     Rate and Rhythm: Normal rate and regular rhythm.  Pulmonary:     Effort: Pulmonary effort is normal.     Breath sounds: Normal breath sounds.  Musculoskeletal:     Cervical back: Neck supple.  Neurological:     Mental Status: She is alert.  Psychiatric:        Mood and Affect: Mood normal.        Behavior: Behavior normal.    ------------------------------------------------------------------------------------------------------------------------------------------------------------------------------------------------------------------- Assessment and Plan  Migraine  headache The addition of propranolol has been helpful for her.  We will continue at current dose.  She will continue Imitrex as needed for breakthrough migraines.   No orders of the defined types were placed in this encounter.   No follow-ups on file.    This visit occurred during the SARS-CoV-2 public health emergency.  Safety protocols were in place, including screening questions prior to the visit, additional usage of staff PPE, and extensive cleaning of exam room while observing appropriate contact time as  indicated for disinfecting solutions.

## 2020-10-31 ENCOUNTER — Other Ambulatory Visit: Payer: Self-pay | Admitting: Family Medicine

## 2020-11-20 ENCOUNTER — Other Ambulatory Visit: Payer: Self-pay | Admitting: Family Medicine

## 2020-12-29 ENCOUNTER — Other Ambulatory Visit: Payer: Self-pay | Admitting: Family Medicine

## 2021-01-02 ENCOUNTER — Other Ambulatory Visit: Payer: Self-pay | Admitting: Family Medicine

## 2021-01-06 ENCOUNTER — Other Ambulatory Visit: Payer: Self-pay | Admitting: Family Medicine

## 2021-03-04 ENCOUNTER — Other Ambulatory Visit: Payer: Self-pay | Admitting: Family Medicine

## 2021-04-02 ENCOUNTER — Other Ambulatory Visit: Payer: Self-pay

## 2021-04-02 ENCOUNTER — Encounter: Payer: Self-pay | Admitting: Family Medicine

## 2021-04-02 ENCOUNTER — Ambulatory Visit: Payer: BC Managed Care – PPO | Admitting: Family Medicine

## 2021-04-02 VITALS — HR 73 | Ht 67.0 in | Wt 271.0 lb

## 2021-04-02 DIAGNOSIS — J4541 Moderate persistent asthma with (acute) exacerbation: Secondary | ICD-10-CM | POA: Diagnosis not present

## 2021-04-02 MED ORDER — METHYLPREDNISOLONE SODIUM SUCC 125 MG IJ SOLR
125.0000 mg | Freq: Once | INTRAMUSCULAR | Status: AC
  Administered 2021-04-02: 11:00:00 125 mg via INTRAMUSCULAR

## 2021-04-02 MED ORDER — PREDNISONE 50 MG PO TABS: ORAL_TABLET | ORAL | 0 refills | Status: DC

## 2021-04-02 MED ORDER — ALBUTEROL SULFATE (2.5 MG/3ML) 0.083% IN NEBU: 2.5000 mg | INHALATION_SOLUTION | Freq: Four times a day (QID) | RESPIRATORY_TRACT | 1 refills | Status: AC | PRN

## 2021-04-02 MED ORDER — FLUTICASONE PROPIONATE HFA 110 MCG/ACT IN AERO: 2.0000 | INHALATION_SPRAY | Freq: Two times a day (BID) | RESPIRATORY_TRACT | 12 refills | Status: DC

## 2021-04-02 MED ORDER — ALBUTEROL SULFATE HFA 108 (90 BASE) MCG/ACT IN AERS: INHALATION_SPRAY | RESPIRATORY_TRACT | 1 refills | Status: DC

## 2021-04-02 NOTE — Progress Notes (Signed)
Carrie Gibson - 45 y.o. female MRN [id number]  Date of birth: [birth year]  Subjective Chief Complaint  Patient presents with   Asthma    HPI Carrie Gibson is a 45 year old female here today for with complaint of asthma exacerbation.  Reports symptoms started about 3 days ago.  She has been out of her albuterol nebulizer solution.  She does report that following change of Qvar will start first year.  Flovent is covered at this time.  She has not had any fever, chills, GI symptoms.  She has had some sinus congestion.  ROS:  A comprehensive ROS was completed and negative except as noted per HPI  Allergies  Allergen Reactions   Topamax [Topiramate] Other (See Comments)    Bad paresthesia     Past Medical History:  Diagnosis Date   Asthma    Environmental allergies    Morbid obesity ([place])     Past Surgical History:  Procedure Laterality Date   CESAREAN SECTION N/A 01/11/2015   Procedure: CESAREAN SECTION;  Surgeon: Emily Filbert, MD;  Location: [facility];  Service: Obstetrics;  Laterality: N/A;   FOOT SURGERY  1995    Social History   Socioeconomic History   Marital status: Married    Spouse name: Not on file   Number of children: Not on file   Years of education: Not on file   Highest education level: Not on file  Occupational History   Not on file  Tobacco Use   Smoking status: Former   Smokeless tobacco: Never  Vaping Use   Vaping Use: Never used  Substance and Sexual Activity   Alcohol use: No    Alcohol/week: 0.0 standard drinks   Drug use: No   Sexual activity: Yes    Partners: Male  Other Topics Concern   Not on file  Social History Narrative   Not on file   Social Determinants of Health   Financial Resource Strain: Not on file  Food Insecurity: Not on file  Transportation Needs: Not on file  Physical Activity: Not on file  Stress: Not on file  Social Connections: Not on file    Family History  Problem Relation Age of Onset   Fibroids Mother    Mental illness  Father    Fibroids Sister     Health Maintenance  Topic Date Due   Pneumococcal Vaccine 59-39 Years old (1 - PCV) Never done   Hepatitis C Screening  Never done   PAP SMEAR-Modifier  06/08/2019   COVID-19 Vaccine (4 - Booster for Pfizer series) 03/17/2020   TETANUS/TDAP  10/27/2024   INFLUENZA VACCINE  Completed   HIV Screening  Completed   HPV VACCINES  Aged Out     ----------------------------------------------------------------------------------------------------------------------------------------------------------------------------------------------------------------- Physical Exam Pulse 73    Ht 5\' 7"  (1.702 m)    Wt 271 lb (122.9 kg)    SpO2 99%    BMI 42.44 kg/m   Physical Exam Constitutional:      Appearance: Normal appearance.  Eyes:     General: No scleral icterus. Cardiovascular:     Rate and Rhythm: Normal rate and regular rhythm.  Pulmonary:     Effort: Pulmonary effort is normal.     Breath sounds: Wheezing present.  Musculoskeletal:     Cervical back: Neck supple.  Neurological:     General: No focal deficit present.     Mental Status: She is alert.  Psychiatric:        Mood and Affect: Mood normal.  Behavior: Behavior normal.    ------------------------------------------------------------------------------------------------------------------------------------------------------------------------------------------------------------------- Assessment and Plan  Moderate persistent asthma with exacerbation Injection of Solu-Medrol given today.  Start 5-day course of prednisone starting tomorrow.  No indication for antibiotics at this time.  Albuterol refilled.  Qvar changed to Flovent.    Meds ordered this encounter  Medications   albuterol (PROVENTIL) (2.5 MG/3ML) 0.083% nebulizer solution    Sig: Take 3 mLs (2.5 mg total) by nebulization every 6 (six) hours as needed for wheezing or shortness of breath.    Dispense:  150 mL    Refill:  1    fluticasone (FLOVENT HFA) 110 MCG/ACT inhaler    Sig: Inhale 2 puffs into the lungs in the morning and at bedtime.    Dispense:  1 each    Refill:  12   predniSONE (DELTASONE) 50 MG tablet    Sig: Take 1 tab po daily x5 days    Dispense:  5 tablet    Refill:  0   albuterol (VENTOLIN HFA) 108 (90 Base) MCG/ACT inhaler    Sig: INHALE 1-2 PUFFS BY MOUTH EVERY 6 HOURS AS NEEDED FOR WHEEZE OR SHORTNESS OF BREATH    Dispense:  18 each    Refill:  1   methylPREDNISolone sodium succinate (SOLU-MEDROL) 125 mg/2 mL injection 125 mg    No follow-ups on file.    This visit occurred during the SARS-CoV-2 public health emergency.  Safety protocols were in place, including screening questions prior to the visit, additional usage of staff PPE, and extensive cleaning of exam room while observing appropriate contact time as indicated for disinfecting solutions.

## 2021-04-02 NOTE — Patient Instructions (Signed)
Asthma Attack Asthma attack, also called acute bronchospasm, is the sudden narrowing and tightening of the air passages, which limits the amount of oxygen that can get into the lungs. The narrowing is caused by inflammation and tightening of the muscles in the air tubes (bronchi) in the lungs. Too much mucus is also produced, which narrows the airways more. This can cause trouble breathing, loud breathing (wheezing), and coughing. The goal of treatment is to open the airways in the lungs and reduce inflammation. What are the causes? Possible causes or triggers of this condition include: Animal dander, dust mites, or cockroaches. Mold, pollen from trees or grass, or cold air. Air pollutants such as dust, household cleaners, aerosol sprays, strong chemicals, strong odors, and smoke of any kind. Stress or strong emotions such as crying or laughing hard. Exercise or activity that requires a lot of energy. Substances in foods and drinks, such as dried fruits and wine, called sulfites. Certain medicines or medical conditions such as: Aspirin or beta-blockers. Infections or inflammatory conditions, such as a flu (influenza), a cold, pneumonia, or inflammation of the nasal membranes (rhinitis). Gastroesophageal reflux disease (GERD). GERD is a condition in which stomach acid backs up into your esophagus and spills into your trachea (windpipe), which can irritate your airways. What are the signs or symptoms? Symptoms of this condition include: Wheezing. This may sound like whistling while breathing. This may only happen at night. Excessive coughing. This may only happen at night. Chest tightness or pain. Shortness of breath. Feeling like you cannot get enough air no matter how hard you breathe (air hunger). How is this diagnosed? This condition may be diagnosed based on: Your medical history. Your symptoms. A physical exam. Tests to check for other causes of your symptoms or other conditions that  may have triggered your asthma attack. These tests may include: A chest X-ray. Blood tests. Tests to assess lung function, such as breathing into a device that measures how much air you can inhale and exhale (spirometry). How is this treated? Treatment for this condition depends on the severity and cause of your asthma attack. For mild attacks, you may receive medicines through a hand-held inhaler (metered dose inhaler, or MDI) or through a device that turns liquid medicine into a mist (nebulizer). These medicines include: Quick relief or rescue medicines that quickly relax the airways and lungs. Long-acting medicines that are used daily to prevent (control) your asthma symptoms. For moderate or severe attacks, you may be treated with steroid medicines by mouth or through an IV injection at the hospital. For severe attacks, you may need oxygen therapy or a breathing machine (ventilator). If your asthma attack was caused by an infection from bacteria, you will be given antibiotic medicines. Follow these instructions at home: Medicines Take over-the-counter and prescription medicines only as told by your health care provider. Keep your medicines up-to-date. Make sure you have all of your medicines available at all times. If you were prescribed an antibiotic medicine, take it as told by your health care provider. Do not stop taking the antibiotic even if you start to feel better. Tell your doctor if you may be pregnant to make sure your asthma medicine is safe to use during pregnancy. Avoiding triggers  Keep track of things that trigger your asthma attacks. Avoid exposure to these triggers. Do not use any products that contain nicotine or tobacco, such as cigarettes, e-cigarettes, and chewing tobacco. If you need help quitting, ask your health care provider. When there is  a lot of pollen, air pollution, or humidity, keep windows closed and use an air conditioner or go to places with air  conditioning. Asthma action plan Work with your health care provider to make a written plan for managing and treating your asthma attacks (asthma action plan). This plan should include: A list of your asthma triggers and how to avoid them. A list of symptoms that you may have during an asthma attack. Information about which medicine to take, when to take the medicine and how much of the medicine to take. Information to help you understand your peak flow measurements. Daily actions that you can take to control your asthma symptoms. Contact information for your health care providers. If you have an asthma attack, act quickly. Follow the emergency steps on your written asthma action plan. This may prevent you from needing to go to the hospital. General instructions Avoid excessive exercise or activity until your asthma attack goes away. Ask your health care provider what activities are safe for you and when you can return to your normal activities. Stay up to date on all your vaccines, such as flu and pneumonia vaccines. Drink enough fluid to keep your urine pale yellow. Staying hydrated helps keep mucus in your lungs thin so it can be coughed up easily. Do not use alcohol until you have recovered. Keep all follow-up visits as told by your health care provider. This is important. Asthma requires careful medical care. Contact a health care provider if: You have followed your action plan for 1 hour and your peak flow reading is still at 50-79%. This is in the yellow zone, which means "caution." You need to use your quick reliever medicine more frequently than normal. Your medicines are causing side effects, such as rash, itching, swelling, or trouble breathing. Your symptoms do not improve after 48 hours. You cough up mucus that is thicker than usual. You have a fever. Get help right away if: Your peak flow reading is less than 50% of your personal best. This is in the red zone, which means  "danger." You have trouble breathing. You develop chest pain or discomfort. Your medicines no longer seem to be helping. You are coughing up bloody mucus. You have a fever and your symptoms suddenly get worse. You have trouble swallowing. You feel very tired, and breathing becomes tiring. These symptoms may represent a serious problem that is an emergency. Do not wait to see if the symptoms will go away. Get medical help right away. Call your local emergency services (911 in the [place] Do not drive yourself to the hospital. Summary Asthma attacks are caused by narrowing or tightness in air passages, which causes shortness of breath, coughing, and loud breathing (wheezing). Many things can trigger an asthma attack, such as allergens, weather changes, exercise, strong odors, and smoke of any kind. If you have an asthma attack, act quickly. Follow the emergency steps on your written asthma action plan. Get help right away if you have severe trouble breathing, chest pain, or fever, or if your home medicines are no longer helping with your symptoms. This information is not intended to replace advice given to you by your health care provider. Make sure you discuss any questions you have with your health care provider. Document Revised: 03/27/2019 Document Reviewed: 03/27/2019 Elsevier Patient Education  2022 [employer].

## 2021-04-02 NOTE — Assessment & Plan Note (Signed)
Injection of Solu-Medrol given today.  Start 5-day course of prednisone starting tomorrow.  No indication for antibiotics at this time.  Albuterol refilled.  Qvar changed to Flovent.

## 2021-04-12 ENCOUNTER — Encounter: Payer: Self-pay | Admitting: Family Medicine

## 2021-04-14 ENCOUNTER — Ambulatory Visit: Payer: BC Managed Care – PPO | Admitting: Family Medicine

## 2021-04-14 ENCOUNTER — Encounter: Payer: Self-pay | Admitting: Family Medicine

## 2021-04-14 ENCOUNTER — Ambulatory Visit: Payer: BC Managed Care – PPO | Admitting: Physician Assistant

## 2021-04-14 ENCOUNTER — Other Ambulatory Visit: Payer: Self-pay

## 2021-04-14 DIAGNOSIS — J329 Chronic sinusitis, unspecified: Secondary | ICD-10-CM

## 2021-04-14 DIAGNOSIS — J40 Bronchitis, not specified as acute or chronic: Secondary | ICD-10-CM | POA: Diagnosis not present

## 2021-04-14 MED ORDER — BENZONATATE 200 MG PO CAPS: 200.0000 mg | ORAL_CAPSULE | Freq: Two times a day (BID) | ORAL | 0 refills | Status: DC | PRN

## 2021-04-14 MED ORDER — PREDNISONE 50 MG PO TABS: ORAL_TABLET | ORAL | 0 refills | Status: DC

## 2021-04-14 MED ORDER — DOXYCYCLINE HYCLATE 100 MG PO TABS: 100.0000 mg | ORAL_TABLET | Freq: Two times a day (BID) | ORAL | 0 refills | Status: AC

## 2021-04-19 DIAGNOSIS — J40 Bronchitis, not specified as acute or chronic: Secondary | ICD-10-CM | POA: Insufficient documentation

## 2021-04-19 DIAGNOSIS — J329 Chronic sinusitis, unspecified: Secondary | ICD-10-CM | POA: Insufficient documentation

## 2021-04-19 NOTE — Assessment & Plan Note (Signed)
Starting course of doxycycline along with prednisone.  We will add Tessalon as needed for cough.  She may also use over-the-counter medication for symptom control.  Encouraged to increase fluid intake, humidifier in house may be helpful.

## 2021-04-19 NOTE — Progress Notes (Signed)
Carrie Gibson - 46 y.o. female MRN [id number]  Date of birth: [birth year]  Subjective No chief complaint on file.   HPI Carrie Gibson is a 46 year old female here today with complaint of chest and nasal congestion, cough that is productive of thick yellow sputum, intermittent wheezing and fatigue.  Symptoms started approximately 1 week ago.  She has taken a COVID test x2 which was negative.  She has not had fever.  She has having some sinus pain associated with this.  She is using her inhaler more often.  She has only had mild improvement of her symptoms with over-the-counter medications.  ROS:  A comprehensive ROS was completed and negative except as noted per HPI  Allergies  Allergen Reactions   Topamax [Topiramate] Other (See Comments)    Bad paresthesia     Past Medical History:  Diagnosis Date   Asthma    Environmental allergies    Morbid obesity ([place])     Past Surgical History:  Procedure Laterality Date   CESAREAN SECTION N/A 01/11/2015   Procedure: CESAREAN SECTION;  Surgeon: Emily Filbert, MD;  Location: [facility];  Service: Obstetrics;  Laterality: N/A;   FOOT SURGERY  1995    Social History   Socioeconomic History   Marital status: Married    Spouse name: Not on file   Number of children: Not on file   Years of education: Not on file   Highest education level: Not on file  Occupational History   Not on file  Tobacco Use   Smoking status: Former   Smokeless tobacco: Never  Vaping Use   Vaping Use: Never used  Substance and Sexual Activity   Alcohol use: No    Alcohol/week: 0.0 standard drinks   Drug use: No   Sexual activity: Yes    Partners: Male  Other Topics Concern   Not on file  Social History Narrative   Not on file   Social Determinants of Health   Financial Resource Strain: Not on file  Food Insecurity: Not on file  Transportation Needs: Not on file  Physical Activity: Not on file  Stress: Not on file  Social Connections: Not on file    Family History   Problem Relation Age of Onset   Fibroids Mother    Mental illness Father    Fibroids Sister     Health Maintenance  Topic Date Due   Pneumococcal Vaccine 29-73 Years old (1 - PCV) Never done   Hepatitis C Screening  Never done   PAP SMEAR-Modifier  06/08/2019   COVID-19 Vaccine (4 - Booster for Pfizer series) 03/17/2020   TETANUS/TDAP  10/27/2024   INFLUENZA VACCINE  Completed   HIV Screening  Completed   HPV VACCINES  Aged Out     ----------------------------------------------------------------------------------------------------------------------------------------------------------------------------------------------------------------- Physical Exam BP (!) 130/96 (BP Location: Right Arm, Patient Position: Sitting, Cuff Size: Large)    Temp 98 F (36.7 C)    Ht 5\' 7"  (1.702 m)    Wt 271 lb (122.9 kg)    BMI 42.44 kg/m   Physical Exam Constitutional:      Appearance: Normal appearance.  HENT:     Head: Normocephalic and atraumatic.  Eyes:     General: No scleral icterus. Cardiovascular:     Rate and Rhythm: Normal rate and regular rhythm.  Pulmonary:     Effort: Pulmonary effort is normal.     Breath sounds: Wheezing present.  Musculoskeletal:     Cervical back: Neck supple.  Neurological:  General: No focal deficit present.     Mental Status: She is alert.  Psychiatric:        Mood and Affect: Mood normal.        Behavior: Behavior normal.    ------------------------------------------------------------------------------------------------------------------------------------------------------------------------------------------------------------------- Assessment and Plan  Sinobronchitis Starting course of doxycycline along with prednisone.  We will add Tessalon as needed for cough.  She may also use over-the-counter medication for symptom control.  Encouraged to increase fluid intake, humidifier in house may be helpful.   Meds ordered this encounter   Medications   predniSONE (DELTASONE) 50 MG tablet    Sig: Take 1 tab po daily x5 days    Dispense:  5 tablet    Refill:  0   doxycycline (VIBRA-TABS) 100 MG tablet    Sig: Take 1 tablet (100 mg total) by mouth 2 (two) times daily for 10 days.    Dispense:  20 tablet    Refill:  0   benzonatate (TESSALON) 200 MG capsule    Sig: Take 1 capsule (200 mg total) by mouth 2 (two) times daily as needed for cough.    Dispense:  20 capsule    Refill:  0    No follow-ups on file.    This visit occurred during the SARS-CoV-2 public health emergency.  Safety protocols were in place, including screening questions prior to the visit, additional usage of staff PPE, and extensive cleaning of exam room while observing appropriate contact time as indicated for disinfecting solutions.

## 2021-06-02 ENCOUNTER — Other Ambulatory Visit: Payer: Self-pay | Admitting: Family Medicine

## 2021-06-26 ENCOUNTER — Other Ambulatory Visit: Payer: Self-pay | Admitting: Family Medicine

## 2021-07-17 ENCOUNTER — Other Ambulatory Visit: Payer: Self-pay | Admitting: Family Medicine

## 2021-08-24 ENCOUNTER — Telehealth: Payer: Self-pay | Admitting: General Practice

## 2021-08-24 NOTE — Telephone Encounter (Signed)
Transition Care Management Follow-up Telephone Call ?Date of discharge and from where: 08/21/21 from [facility] ?How have you been since you were released from the hospital? She got injured at work and broke her foot. She will be following with the worker's comp. To see the orthopedics.  ?Any questions or concerns? No ? ?Items Reviewed: ?Did the pt receive and understand the discharge instructions provided? Yes  ?Medications obtained and verified? No  ?Other? No  ?Any new allergies since your discharge? No  ?Dietary orders reviewed? Yes ?Do you have support at home? Yes  ? ?Home Care and Equipment/Supplies: ?Were home health services ordered? no ? ?Functional Questionnaire: (I = Independent and D = Dependent) ?ADLs: I ? ?Bathing/Dressing- I ? ?Meal Prep- I ? ?Eating- I ? ?Maintaining continence- I ? ?Transferring/Ambulation- I ? ?Managing Meds- I ? ?Follow up appointments reviewed: ? ?PCP Hospital f/u appt confirmed? No   ?Specialist Hospital f/u appt confirmed? No   ?Are transportation arrangements needed? No  ?If their condition worsens, is the pt aware to call PCP or go to the Emergency Dept.? Yes ?Was the patient provided with contact information for the PCP's office or ED? Yes ?Was to pt encouraged to call back with questions or concerns? Yes  ?

## 2021-08-31 ENCOUNTER — Other Ambulatory Visit: Payer: Self-pay | Admitting: Family Medicine

## 2021-09-28 ENCOUNTER — Telehealth: Payer: Self-pay | Admitting: Family Medicine

## 2021-09-28 NOTE — Telephone Encounter (Signed)
Pt called in to request a change in medication. She was told by her pharmacy that starting in July, her insurance will no longer cover her Flonase. I checked the schedule to get her an appt, but there is nothing available.

## 2021-09-28 NOTE — Telephone Encounter (Signed)
Contacted patient regarding her previous message. Per patient - Flovent inhaler will no longer be covered by her insurance in July. Patient informed to call their insurance to see which inhaler will be covered by the insurance. Patient will send a MyChart or return a call back with the preferred formulary information. Direct call back info provided.

## 2021-09-28 NOTE — Telephone Encounter (Signed)
Flonase is available OTC.

## 2021-10-09 ENCOUNTER — Encounter: Payer: Self-pay | Admitting: Family Medicine

## 2021-10-12 MED ORDER — PULMICORT FLEXHALER 180 MCG/ACT IN AEPB: 2.0000 | INHALATION_SPRAY | Freq: Two times a day (BID) | RESPIRATORY_TRACT | 1 refills | Status: DC

## 2021-10-12 NOTE — Telephone Encounter (Signed)
Updated rx sent in.

## 2021-10-12 NOTE — Addendum Note (Signed)
Addended by: Luetta Nutting E on: 10/12/2021 10:00 AM   Modules accepted: Orders

## 2021-11-26 ENCOUNTER — Other Ambulatory Visit: Payer: Self-pay | Admitting: Family Medicine

## 2021-11-26 NOTE — Telephone Encounter (Signed)
Patient has been scheduled for Dr Zigmund Daniel next available appointment Physical slot on October 5th. AMUCK

## 2021-12-21 ENCOUNTER — Other Ambulatory Visit: Payer: Self-pay | Admitting: Family Medicine

## 2022-01-05 ENCOUNTER — Other Ambulatory Visit: Payer: Self-pay | Admitting: Family Medicine

## 2022-01-07 ENCOUNTER — Encounter: Payer: Self-pay | Admitting: Emergency Medicine

## 2022-01-07 ENCOUNTER — Ambulatory Visit
Admission: EM | Admit: 2022-01-07 | Discharge: 2022-01-07 | Disposition: A | Discharge status: Home / Self Care | Payer: BC Managed Care – PPO | Attending: Physician Assistant | Admitting: Physician Assistant

## 2022-01-07 ENCOUNTER — Ambulatory Visit (INDEPENDENT_AMBULATORY_CARE_PROVIDER_SITE_OTHER): Payer: BC Managed Care – PPO

## 2022-01-07 DIAGNOSIS — J4541 Moderate persistent asthma with (acute) exacerbation: Secondary | ICD-10-CM

## 2022-01-07 DIAGNOSIS — R059 Cough, unspecified: Secondary | ICD-10-CM

## 2022-01-07 DIAGNOSIS — R0602 Shortness of breath: Secondary | ICD-10-CM | POA: Diagnosis not present

## 2022-01-07 MED ORDER — METHYLPREDNISOLONE SODIUM SUCC 125 MG IJ SOLR
60.0000 mg | Freq: Once | INTRAMUSCULAR | Status: AC
  Administered 2022-01-07: 60 mg via INTRAMUSCULAR

## 2022-01-07 MED ORDER — PREDNISONE 10 MG (21) PO TBPK: ORAL_TABLET | ORAL | 0 refills | Status: DC

## 2022-01-07 MED ORDER — IPRATROPIUM-ALBUTEROL 0.5-2.5 (3) MG/3ML IN SOLN
3.0000 mL | Freq: Once | RESPIRATORY_TRACT | Status: AC
  Administered 2022-01-07: 3 mL via RESPIRATORY_TRACT

## 2022-01-07 NOTE — Discharge Instructions (Signed)
Your x-ray was normal with no evidence of infection.  I am concerned that your asthma has been flared.  Continue your albuterol inhaler/nebulizer as needed every 4-6 hours.  Take all of your maintenance medication as prescribed.  We are starting a prednisone taper.  This should be started tomorrow (01/08/2022) since we gave you an injection of steroids today.  Do not take NSAIDs with this medication including aspirin, ibuprofen/Advil, naproxen/Aleve.  You can use Tylenol/acetaminophen.  Make sure you rest and drink plenty of fluid.  If anything worsens and you have worsening cough, fever, chest pain, shortness of breath you should be seen immediately.

## 2022-01-07 NOTE — ED Triage Notes (Signed)
Pt states about 3-4 days ago she had cold symptoms/congestion but last night she developed shortness of breath and felt like her asthma was flaring up. She used inhaler and nebulizer with some relief but states today she is wheezing and coughing.

## 2022-01-07 NOTE — ED Provider Notes (Signed)
[facility]    CSN: [id number] Arrival date & time: 01/07/22  1749      History   Chief Complaint Chief Complaint  Patient presents with   Cough    HPI Carrie Gibson is a 46 y.o. female.   Patient is today with a 10-day history of URI symptoms including cough/cold.  Reports that the symptoms improved but she believes that her asthma has been flared as she woke up overnight with significant shortness of breath and coughing fits.  She initially used her albuterol inhaler which was ineffective so used a nebulizer treatment which provided improvement but not resolution of symptoms.  She continues to have chest tightness, shortness of breath, reactive cough.  Denies any fever, chest pain, nausea, vomiting, diarrhea.  Denies any recent antibiotics or steroids.  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  She has been taking maintenance asthma medication as prescribed.  Denies previous hospitalization or intubation related to asthma flare.  She does not believe she is pregnant.  Denies history of diabetes.    Past Medical History:  Diagnosis Date   Asthma    Environmental allergies    Morbid obesity [facility])     Patient Active Problem List   Diagnosis Date Noted   Sinobronchitis 04/19/2021   Moderate persistent asthma with exacerbation 04/02/2021   Allergic rhinitis 08/15/2019   Lumbar strain 02/15/2017   Headache 04/22/2016   Migraine headache 04/22/2016   GERD (gastroesophageal reflux disease) 02/18/2016   Toenail avulsion, initial encounter 02/18/2016   Morbid obesity ([place])    S/P cesarean section 06/07/2014   Asthma 04/13/2012   Infertility, female 01/17/2012   History of miscarriage 01/10/2012    Past Surgical History:  Procedure Laterality Date   CESAREAN SECTION N/A 01/11/2015   Procedure: CESAREAN SECTION;  Surgeon: Emily Filbert, MD;  Location: [facility];  Service: Obstetrics;  Laterality: N/A;   FOOT SURGERY  1995    OB  History     Gravida  1   Para  1   Term  1   Preterm      AB      Living  1      SAB      IAB      Ectopic      Multiple  0   Live Births  1            Home Medications    Prior to Admission medications   Medication Sig Start Date End Date Taking? Authorizing Provider  predniSONE (STERAPRED UNI-PAK 21 TAB) 10 MG (21) TBPK tablet As directed 01/07/22  Yes Adin Lariccia K, PA-C  albuterol (PROVENTIL) (2.5 MG/3ML) 0.083% nebulizer solution Take 3 mLs (2.5 mg total) by nebulization every 6 (six) hours as needed for wheezing or shortness of breath. 04/02/21   Luetta Nutting, DO  albuterol (VENTOLIN HFA) 108 (90 Base) MCG/ACT inhaler INHALE 1-2 PUFFS BY MOUTH EVERY 6 HOURS AS NEEDED FOR WHEEZE OR SHORTNESS OF BREATH 06/26/21   Luetta Nutting, DO  budesonide (PULMICORT FLEXHALER) 180 MCG/ACT inhaler Inhale 2 puffs into the lungs in the morning and at bedtime. 10/12/21 01/10/22  Luetta Nutting, DO  cetirizine (ZYRTEC) 10 MG tablet Take 10 mg by mouth daily.    [provider]  fluticasone (FLONASE) 50 MCG/ACT nasal spray APPLY 2 PUMPS DAILY 01/06/21   Luetta Nutting, DO  omeprazole (PRILOSEC) 40 MG capsule TAKE 1 CAPSULE BY MOUTH EVERY DAY 11/26/21  Luetta Nutting, DO  propranolol ER (INDERAL LA) 60 MG 24 hr capsule TAKE 1 CAPSULE BY MOUTH EVERY DAY 12/23/21   Luetta Nutting, DO  SUMAtriptan (IMITREX) 50 MG tablet TAKE 1 TAB BY MOUTH EVERY 2 HOURS AS NEEDED FOR MIGRAINE. MAY REPEAT IN 2 HOURS IF HEADACHE PERSISTS 08/15/19   Luetta Nutting, DO    Family History Family History  Problem Relation Age of Onset   Fibroids Mother    Mental illness Father    Fibroids Sister     Social History Social History   Tobacco Use   Smoking status: Former   Smokeless tobacco: Never  Scientific laboratory technician Use: Never used  Substance Use Topics   Alcohol use: No    Alcohol/week: 0.0 standard drinks of alcohol   Drug use: No     Allergies   Topamax [topiramate]   Review of  Systems Review of Systems  Constitutional:  Positive for activity change. Negative for appetite change, fatigue and fever.  HENT:  Positive for congestion and voice change. Negative for sinus pressure, sneezing, sore throat and trouble swallowing.   Respiratory:  Positive for cough, chest tightness, shortness of breath and wheezing.   Cardiovascular:  Negative for chest pain.  Gastrointestinal:  Negative for abdominal pain, diarrhea, nausea and vomiting.  Neurological:  Negative for dizziness, light-headedness and headaches.     Physical Exam Triage Vital Signs ED Triage Vitals  Enc Vitals Group     BP 01/07/22 1759 (!) 143/90     Pulse Rate 01/07/22 1759 88     Resp 01/07/22 1759 18     Temp 01/07/22 1759 98.6 F (37 C)     Temp Source 01/07/22 1759 Oral     SpO2 01/07/22 1759 96 %     Weight --      Height --      Head Circumference --      Peak Flow --      Pain Score 01/07/22 1801 0     Pain Loc --      Pain Edu? --      Excl. in [place]? --    No data found.  Updated Vital Signs BP (!) 143/90 (BP Location: Right Arm)   Pulse 88   Temp 98.6 F (37 C) (Oral)   Resp 18   LMP 12/31/2021   SpO2 96%   Visual Acuity Right Eye Distance:   Left Eye Distance:   Bilateral Distance:    Right Eye Near:   Left Eye Near:    Bilateral Near:     Physical Exam Vitals reviewed.  Constitutional:      General: She is awake. She is not in acute distress.    Appearance: Normal appearance. She is well-developed. She is not ill-appearing.     Comments: Very pleasant female appears stated age in no acute distress sitting comfortably in exam room  HENT:     Head: Normocephalic and atraumatic.     Right Ear: Ear canal and external ear normal. A middle ear effusion is present. Tympanic membrane is not erythematous or bulging.     Left Ear: Tympanic membrane, ear canal and external ear normal. Tympanic membrane is not erythematous or bulging.     Nose:     Right Sinus: No maxillary  sinus tenderness or frontal sinus tenderness.     Left Sinus: No maxillary sinus tenderness or frontal sinus tenderness.     Mouth/Throat:     Pharynx: Uvula midline.  No oropharyngeal exudate or posterior oropharyngeal erythema.  Cardiovascular:     Rate and Rhythm: Normal rate and regular rhythm.     Heart sounds: Normal heart sounds, S1 normal and S2 normal. No murmur heard. Pulmonary:     Effort: Pulmonary effort is normal.     Breath sounds: Wheezing present. No rhonchi or rales.     Comments: Scattered wheezing.  Reactive cough with deep breathing. Psychiatric:        Behavior: Behavior is cooperative.      UC Treatments / Results  Labs (all labs ordered are listed, but only abnormal results are displayed) Labs Reviewed - No data to display  EKG   Radiology DG Chest 2 View  Result Date: 01/07/2022 CLINICAL DATA:  Cough, shortness of breath. EXAM: CHEST - 2 VIEW COMPARISON:  None Available. FINDINGS: The heart size and mediastinal contours are within normal limits. Both lungs are clear. The visualized skeletal structures are unremarkable. IMPRESSION: No active cardiopulmonary disease. Electronically Signed   By: Marijo Conception M.D.   On: 01/07/2022 18:32    Procedures Procedures (including critical care time)  Medications Ordered in UC Medications  ipratropium-albuterol (DUONEB) 0.5-2.5 (3) MG/3ML nebulizer solution 3 mL (3 mLs Nebulization Given 01/07/22 1832)  methylPREDNISolone sodium succinate (SOLU-MEDROL) 125 mg/2 mL injection 60 mg (60 mg Intramuscular Given 01/07/22 1836)    Initial Impression / Assessment and Plan / UC Course  I have reviewed the triage vital signs and the nursing notes.  Pertinent labs & imaging results that were available during my care of the patient were reviewed by me and considered in my medical decision making (see chart for details).     Patient is well-appearing, afebrile, nontoxic, nontachycardic.  No indication for viral testing as  patient has been symptomatic for over a week and this would not change management.  No evidence of acute infection on physical exam that would warrant initiation of antibiotics.  Chest x-ray was obtained that showed no acute cardiopulmonary disease.  Patient was given 60 mg of Solu-Medrol and DuoNeb in clinic with improvement but not resolution of symptoms.  She was encouraged to continue her albuterol nebulizer/inhaler every 4-6 hours as needed.  We will start prednisone taper tomorrow (01/08/2022) and she was instructed not to take NSAIDs with this medication due to risk of GI bleeding.  Can use Tylenol, Mucinex, Flonase for additional symptom relief.  She is to rest and drink plenty fluid.  Recommended she follow-up with her primary care provider.  Discussed that if she has any persistent or worsening symptoms including severe cough, fever, shortness of breath, nausea/vomiting, persistent asthma symptoms despite regular use of rescue medication she should be seen immediately.  Strict return precautions given.  Work excuse note provided.  Final Clinical Impressions(s) / UC Diagnoses   Final diagnoses:  Moderate persistent asthma with acute exacerbation     Discharge Instructions      Your x-ray was normal with no evidence of infection.  I am concerned that your asthma has been flared.  Continue your albuterol inhaler/nebulizer as needed every 4-6 hours.  Take all of your maintenance medication as prescribed.  We are starting a prednisone taper.  This should be started tomorrow (01/08/2022) since we gave you an injection of steroids today.  Do not take NSAIDs with this medication including aspirin, ibuprofen/Advil, naproxen/Aleve.  You can use Tylenol/acetaminophen.  Make sure you rest and drink plenty of fluid.  If anything worsens and you have worsening cough, fever,  chest pain, shortness of breath you should be seen immediately.     ED Prescriptions     Medication Sig Dispense Auth. Provider    predniSONE (STERAPRED UNI-PAK 21 TAB) 10 MG (21) TBPK tablet As directed 21 tablet Lorain Fettes K, PA-C      PDMP not reviewed this encounter.   Terrilee Croak, PA-C 01/07/22 1907

## 2022-01-13 ENCOUNTER — Ambulatory Visit: Payer: BC Managed Care – PPO | Admitting: Family Medicine

## 2022-01-13 ENCOUNTER — Encounter: Payer: Self-pay | Admitting: Family Medicine

## 2022-01-13 VITALS — BP 110/74 | HR 73 | Ht 67.0 in | Wt 271.0 lb

## 2022-01-13 DIAGNOSIS — Z0000 Encounter for general adult medical examination without abnormal findings: Secondary | ICD-10-CM | POA: Diagnosis not present

## 2022-01-13 DIAGNOSIS — E559 Vitamin D deficiency, unspecified: Secondary | ICD-10-CM

## 2022-01-13 DIAGNOSIS — J40 Bronchitis, not specified as acute or chronic: Secondary | ICD-10-CM

## 2022-01-13 DIAGNOSIS — J329 Chronic sinusitis, unspecified: Secondary | ICD-10-CM

## 2022-01-13 MED ORDER — DOXYCYCLINE HYCLATE 100 MG PO TABS: 100.0000 mg | ORAL_TABLET | Freq: Two times a day (BID) | ORAL | 0 refills | Status: DC

## 2022-01-13 NOTE — Assessment & Plan Note (Signed)
Well adult Orders Placed This Encounter  Procedures  . COMPLETE METABOLIC PANEL WITH GFR  . CBC with Differential  . Lipid Panel w/reflex Direct LDL  . TSH  . Vitamin D (25 hydroxy)  Screenings: Per lab orders Immunizations: Declines flu vaccine at this time Anticipatory guidance/risk factor reduction: Recommendations per AVS

## 2022-01-13 NOTE — Progress Notes (Signed)
Carrie Gibson - 46 y.o. female MRN [id number]  Date of birth: October 17, 1975  Subjective Chief Complaint  Patient presents with   Annual Exam    HPI Carrie Gibson is a 46 year old female here today for annual exam.  Additionally, she reports continued respiratory symptoms.  She has had congestion with wheezing and cough for approximately 3 weeks.  Treated for asthma exacerbation at urgent care on 01/07/2022.  Given steroids with some improvement however continues to have increased sputum production and hoarseness.  Denies fever or chills.  She has been minimally physically active.  She does try to follow a fairly healthy diet most of the time.  She is a non-smoker.  She denies alcohol use.  Review of Systems  Constitutional:  Negative for chills, fever, malaise/fatigue and weight loss.  HENT:  Negative for congestion, ear pain and sore throat.   Eyes:  Negative for blurred vision, double vision and pain.  Respiratory:  Negative for cough and shortness of breath.   Cardiovascular:  Negative for chest pain and palpitations.  Gastrointestinal:  Negative for abdominal pain, blood in stool, constipation, heartburn and nausea.  Genitourinary:  Negative for dysuria and urgency.  Musculoskeletal:  Negative for joint pain and myalgias.  Neurological:  Negative for dizziness and headaches.  Endo/Heme/Allergies:  Does not bruise/bleed easily.  Psychiatric/Behavioral:  Negative for depression. The patient is not nervous/anxious and does not have insomnia.     Allergies  Allergen Reactions   Topamax [Topiramate] Other (See Comments)    Bad paresthesia     Past Medical History:  Diagnosis Date   Asthma    Environmental allergies    Morbid obesity ([place])     Past Surgical History:  Procedure Laterality Date   CESAREAN SECTION N/A 01/11/2015   Procedure: CESAREAN SECTION;  Surgeon: Emily Filbert, MD;  Location: [facility];  Service: Obstetrics;  Laterality: N/A;   FOOT SURGERY  1995    Social History    Socioeconomic History   Marital status: Married    Spouse name: Not on file   Number of children: Not on file   Years of education: Not on file   Highest education level: Not on file  Occupational History   Not on file  Tobacco Use   Smoking status: Former   Smokeless tobacco: Never  Vaping Use   Vaping Use: Never used  Substance and Sexual Activity   Alcohol use: No    Alcohol/week: 0.0 standard drinks of alcohol   Drug use: No   Sexual activity: Yes    Partners: Male  Other Topics Concern   Not on file  Social History Narrative   Not on file   Social Determinants of Health   Financial Resource Strain: Not on file  Food Insecurity: Not on file  Transportation Needs: Not on file  Physical Activity: Not on file  Stress: Not on file  Social Connections: Not on file    Family History  Problem Relation Age of Onset   Fibroids Mother    Mental illness Father    Fibroids Sister     Health Maintenance  Topic Date Due   Hepatitis C Screening  Never done   PAP SMEAR-Modifier  06/08/2019   COVID-19 Vaccine (4 - Pfizer series) 03/17/2020   INFLUENZA VACCINE  11/10/2021   TETANUS/TDAP  10/27/2024   HIV Screening  Completed   HPV VACCINES  Aged Out     ----------------------------------------------------------------------------------------------------------------------------------------------------------------------------------------------------------------- Physical Exam BP 110/74 (BP Location: Left Arm, Patient Position: Sitting, Cuff  Size: Large)   Pulse 73   Ht '5\' 7"'$  (1.702 m)   Wt 271 lb (122.9 kg)   LMP 12/31/2021   SpO2 98%   BMI 42.44 kg/m   Physical Exam Constitutional:      General: She is not in acute distress. HENT:     Head: Normocephalic and atraumatic.     Right Ear: Tympanic membrane and ear canal normal.     Left Ear: Tympanic membrane and ear canal normal.     Nose: Nose normal.  Eyes:     General: No scleral icterus.     Conjunctiva/sclera: Conjunctivae normal.  Neck:     Thyroid: No thyromegaly.  Cardiovascular:     Rate and Rhythm: Normal rate and regular rhythm.     Heart sounds: Normal heart sounds.  Pulmonary:     Effort: Pulmonary effort is normal.     Breath sounds: Normal breath sounds.  Abdominal:     General: Bowel sounds are normal. There is no distension.     Palpations: Abdomen is soft.     Tenderness: There is no abdominal tenderness. There is no guarding.  Musculoskeletal:        General: Normal range of motion.     Cervical back: Normal range of motion and neck supple.  Lymphadenopathy:     Cervical: No cervical adenopathy.  Skin:    General: Skin is warm and dry.     Findings: No rash.  Neurological:     General: No focal deficit present.     Mental Status: She is alert and oriented to person, place, and time.     Cranial Nerves: No cranial nerve deficit.     Coordination: Coordination normal.  Psychiatric:        Mood and Affect: Mood normal.        Behavior: Behavior normal.     ------------------------------------------------------------------------------------------------------------------------------------------------------------------------------------------------------------------- Assessment and Plan  Sinobronchitis Recent completed course of prednisone however continues to have symptoms with increased, thickened sputum.  Recommend increase fluids, Mucinex and will add a course of doxycycline.  Well adult exam Well adult Orders Placed This Encounter  Procedures   COMPLETE METABOLIC PANEL WITH GFR   CBC with Differential   Lipid Panel w/reflex Direct LDL   TSH   Vitamin D (25 hydroxy)  Screenings: Per lab orders Immunizations: Declines flu vaccine at this time Anticipatory guidance/risk factor reduction: Recommendations per AVS   Meds ordered this encounter  Medications   doxycycline (VIBRA-TABS) 100 MG tablet    Sig: Take 1 tablet (100 mg total) by mouth  2 (two) times daily.    Dispense:  20 tablet    Refill:  0    No follow-ups on file.    This visit occurred during the SARS-CoV-2 public health emergency.  Safety protocols were in place, including screening questions prior to the visit, additional usage of staff PPE, and extensive cleaning of exam room while observing appropriate contact time as indicated for disinfecting solutions.

## 2022-01-13 NOTE — Patient Instructions (Signed)
Preventive Care 40-44 Years Old, Female Preventive care refers to lifestyle choices and visits with your health care provider that can promote health and wellness. Preventive care visits are also called wellness exams. What can I expect for my preventive care visit? Counseling Your health care provider may ask you questions about your: Medical history, including: Past medical problems. Family medical history. Pregnancy history. Current health, including: Menstrual cycle. Method of birth control. Emotional well-being. Home life and relationship well-being. Sexual activity and sexual health. Lifestyle, including: Alcohol, nicotine or tobacco, and drug use. Access to firearms. Diet, exercise, and sleep habits. Work and work environment. Sunscreen use. Safety issues such as seatbelt and bike helmet use. Physical exam Your health care provider will check your: Height and weight. These may be used to calculate your BMI (body mass index). BMI is a measurement that tells if you are at a healthy weight. Waist circumference. This measures the distance around your waistline. This measurement also tells if you are at a healthy weight and may help predict your risk of certain diseases, such as type 2 diabetes and high blood pressure. Heart rate and blood pressure. Body temperature. Skin for abnormal spots. What immunizations do I need?  Vaccines are usually given at various ages, according to a schedule. Your health care provider will recommend vaccines for you based on your age, medical history, and lifestyle or other factors, such as travel or where you work. What tests do I need? Screening Your health care provider may recommend screening tests for certain conditions. This may include: Lipid and cholesterol levels. Diabetes screening. This is done by checking your blood sugar (glucose) after you have not eaten for a while (fasting). Pelvic exam and Pap test. Hepatitis B test. Hepatitis C  test. HIV (human immunodeficiency virus) test. STI (sexually transmitted infection) testing, if you are at risk. Lung cancer screening. Colorectal cancer screening. Mammogram. Talk with your health care provider about when you should start having regular mammograms. This may depend on whether you have a family history of breast cancer. BRCA-related cancer screening. This may be done if you have a family history of breast, ovarian, tubal, or peritoneal cancers. Bone density scan. This is done to screen for osteoporosis. Talk with your health care provider about your test results, treatment options, and if necessary, the need for more tests. Follow these instructions at home: Eating and drinking  Eat a diet that includes fresh fruits and vegetables, whole grains, lean protein, and low-fat dairy products. Take vitamin and mineral supplements as recommended by your health care provider. Do not drink alcohol if: Your health care provider tells you not to drink. You are pregnant, may be pregnant, or are planning to become pregnant. If you drink alcohol: Limit how much you have to 0-1 drink a day. Know how much alcohol is in your drink. In the [place] one drink equals one 12 oz bottle of beer (355 mL), one 5 oz glass of wine (148 mL), or one 1 oz glass of hard liquor (44 mL). Lifestyle Brush your teeth every morning and night with fluoride toothpaste. Floss one time each day. Exercise for at least 30 minutes 5 or more days each week. Do not use any products that contain nicotine or tobacco. These products include cigarettes, chewing tobacco, and vaping devices, such as e-cigarettes. If you need help quitting, ask your health care provider. Do not use drugs. If you are sexually active, practice safe sex. Use a condom or other form of protection to   prevent STIs. If you do not wish to become pregnant, use a form of birth control. If you plan to become pregnant, see your health care provider for a  prepregnancy visit. Take aspirin only as told by your health care provider. Make sure that you understand how much to take and what form to take. Work with your health care provider to find out whether it is safe and beneficial for you to take aspirin daily. Find healthy ways to manage stress, such as: Meditation, yoga, or listening to music. Journaling. Talking to a trusted person. Spending time with friends and family. Minimize exposure to UV radiation to reduce your risk of skin cancer. Safety Always wear your seat belt while driving or riding in a vehicle. Do not drive: If you have been drinking alcohol. Do not ride with someone who has been drinking. When you are tired or distracted. While texting. If you have been using any mind-altering substances or drugs. Wear a helmet and other protective equipment during sports activities. If you have firearms in your house, make sure you follow all gun safety procedures. Seek help if you have been physically or sexually abused. What's next? Visit your health care provider once a year for an annual wellness visit. Ask your health care provider how often you should have your eyes and teeth checked. Stay up to date on all vaccines. This information is not intended to replace advice given to you by your health care provider. Make sure you discuss any questions you have with your health care provider. Document Revised: 09/24/2020 Document Reviewed: 09/24/2020 Elsevier Patient Education  [place].

## 2022-01-13 NOTE — Assessment & Plan Note (Signed)
Recent completed course of prednisone however continues to have symptoms with increased, thickened sputum.  Recommend increase fluids, Mucinex and will add a course of doxycycline.

## 2022-01-14 ENCOUNTER — Encounter: Payer: BC Managed Care – PPO | Admitting: Family Medicine

## 2022-01-19 ENCOUNTER — Encounter: Payer: BC Managed Care – PPO | Admitting: Family Medicine

## 2022-01-26 ENCOUNTER — Other Ambulatory Visit: Payer: Self-pay | Admitting: Family Medicine

## 2022-02-10 LAB — TSH: TSH: 1.99 mIU/L

## 2022-02-10 LAB — CBC WITH DIFFERENTIAL/PLATELET
Absolute Monocytes: 583 cells/uL (ref 200–950)
Basophils Absolute: 41 cells/uL (ref 0–200)
Basophils Relative: 0.5 %
Eosinophils Absolute: 243 cells/uL (ref 15–500)
Eosinophils Relative: 3 %
HCT: 37.4 % (ref 35.0–45.0)
Hemoglobin: 12.2 g/dL (ref 11.7–15.5)
Lymphs Abs: 1863 cells/uL (ref 850–3900)
MCH: 26.2 pg — ABNORMAL LOW (ref 27.0–33.0)
MCHC: 32.6 g/dL (ref 32.0–36.0)
MCV: 80.4 fL (ref 80.0–100.0)
MPV: 9.7 fL (ref 7.5–12.5)
Monocytes Relative: 7.2 %
Neutro Abs: 5370 cells/uL (ref 1500–7800)
Neutrophils Relative %: 66.3 %
Platelets: 367 10*3/uL (ref 140–400)
RBC: 4.65 10*6/uL (ref 3.80–5.10)
RDW: 13.7 % (ref 11.0–15.0)
Total Lymphocyte: 23 %
WBC: 8.1 10*3/uL (ref 3.8–10.8)

## 2022-02-10 LAB — COMPLETE METABOLIC PANEL WITH GFR
AG Ratio: 1.2 (calc) (ref 1.0–2.5)
ALT: 8 U/L (ref 6–29)
AST: 8 U/L — ABNORMAL LOW (ref 10–30)
Albumin: 3.8 g/dL (ref 3.6–5.1)
Alkaline phosphatase (APISO): 78 U/L (ref 31–125)
BUN: 9 mg/dL (ref 7–25)
CO2: 27 mmol/L (ref 20–32)
Calcium: 8.9 mg/dL (ref 8.6–10.2)
Chloride: 106 mmol/L (ref 98–110)
Creat: 0.6 mg/dL (ref 0.50–0.99)
Globulin: 3.2 g/dL (calc) (ref 1.9–3.7)
Glucose, Bld: 95 mg/dL (ref 65–99)
Potassium: 4.2 mmol/L (ref 3.5–5.3)
Sodium: 139 mmol/L (ref 135–146)
Total Bilirubin: 0.3 mg/dL (ref 0.2–1.2)
Total Protein: 7 g/dL (ref 6.1–8.1)
eGFR: 113 mL/min/{1.73_m2} (ref >=60)

## 2022-02-10 LAB — LIPID PANEL W/REFLEX DIRECT LDL
Cholesterol: 188 mg/dL (ref <200)
HDL: 34 mg/dL — ABNORMAL LOW (ref >=50)
LDL Cholesterol (Calc): 120 mg/dL (calc) — ABNORMAL HIGH
Non-HDL Cholesterol (Calc): 154 mg/dL (calc) — ABNORMAL HIGH (ref <130)
Total CHOL/HDL Ratio: 5.5 (calc) — ABNORMAL HIGH (ref <5.0)
Triglycerides: 222 mg/dL — ABNORMAL HIGH (ref <150)

## 2022-02-10 LAB — VITAMIN D 25 HYDROXY (VIT D DEFICIENCY, FRACTURES): Vit D, 25-Hydroxy: 16 ng/mL — ABNORMAL LOW (ref 30–100)

## 2022-02-26 ENCOUNTER — Other Ambulatory Visit: Payer: Self-pay | Admitting: Family Medicine

## 2022-04-22 ENCOUNTER — Encounter: Payer: Self-pay | Admitting: Family Medicine

## 2022-04-22 ENCOUNTER — Ambulatory Visit: Payer: BC Managed Care – PPO | Admitting: Family Medicine

## 2022-04-22 VITALS — BP 145/89 | HR 76 | Ht 67.0 in | Wt 278.0 lb

## 2022-04-22 DIAGNOSIS — M7581 Other shoulder lesions, right shoulder: Secondary | ICD-10-CM

## 2022-04-22 DIAGNOSIS — F418 Other specified anxiety disorders: Secondary | ICD-10-CM | POA: Diagnosis not present

## 2022-04-22 DIAGNOSIS — Z23 Encounter for immunization: Secondary | ICD-10-CM

## 2022-04-22 MED ORDER — HYDROXYZINE PAMOATE 25 MG PO CAPS: 25.0000 mg | ORAL_CAPSULE | Freq: Three times a day (TID) | ORAL | 0 refills | Status: DC | PRN

## 2022-04-22 MED ORDER — ESCITALOPRAM OXALATE 10 MG PO TABS: ORAL_TABLET | ORAL | 0 refills | Status: DC

## 2022-04-22 MED ORDER — PREDNISONE 50 MG PO TABS: ORAL_TABLET | ORAL | 0 refills | Status: DC

## 2022-04-25 ENCOUNTER — Encounter: Payer: Self-pay | Admitting: Family Medicine

## 2022-04-25 DIAGNOSIS — M7581 Other shoulder lesions, right shoulder: Secondary | ICD-10-CM | POA: Insufficient documentation

## 2022-04-25 DIAGNOSIS — F418 Other specified anxiety disorders: Secondary | ICD-10-CM | POA: Insufficient documentation

## 2022-04-25 NOTE — Progress Notes (Signed)
Carrie Gibson - 47 y.o. female MRN [id number]  Date of birth: 1975-08-20  Subjective Chief Complaint  Patient presents with   health concerns    arm   Arm Pain   Weight Loss    HPI Carrie Gibson is a 47 year old female here today for follow-up visit.  Does complain of pain in the right shoulder.  She does not recall any known injury or overuse.  She is having pain lifting arm overhead.  She denies radiation down the arm, numbness or tingling.  She also reports increased anxiety over the past several months.  She reports multiple stressors at this time.  She has tried changes to sleep patterns, appetite and energy levels. She is interested in adding medication to help with her anxiety.  She is never taken anything in the past.  ROS:  A comprehensive ROS was completed and negative except as noted per HPI  Allergies  Allergen Reactions   Topamax [Topiramate] Other (See Comments)    Bad paresthesia     Past Medical History:  Diagnosis Date   Asthma    Environmental allergies    Morbid obesity ([place])     Past Surgical History:  Procedure Laterality Date   CESAREAN SECTION N/A 01/11/2015   Procedure: CESAREAN SECTION;  Surgeon: Emily Filbert, MD;  Location: [facility];  Service: Obstetrics;  Laterality: N/A;   FOOT SURGERY  1995    Social History   Socioeconomic History   Marital status: Married    Spouse name: Not on file   Number of children: Not on file   Years of education: Not on file   Highest education level: Not on file  Occupational History   Not on file  Tobacco Use   Smoking status: Former   Smokeless tobacco: Never  Vaping Use   Vaping Use: Never used  Substance and Sexual Activity   Alcohol use: No    Alcohol/week: 0.0 standard drinks of alcohol   Drug use: No   Sexual activity: Yes    Partners: Male  Other Topics Concern   Not on file  Social History Narrative   Not on file   Social Determinants of Health   Financial Resource Strain: Not on file  Food Insecurity:  Not on file  Transportation Needs: Not on file  Physical Activity: Not on file  Stress: Not on file  Social Connections: Not on file    Family History  Problem Relation Age of Onset   Fibroids Mother    Mental illness Father    Fibroids Sister     Health Maintenance  Topic Date Due   PAP SMEAR-Modifier  06/08/2019   COVID-19 Vaccine (4 - 2023-24 season) 05/08/2022 (Originally 12/11/2021)   Hepatitis C Screening  04/23/2023 (Originally 07/19/1995)   DTaP/Tdap/Td (3 - Td or Tdap) 10/27/2024   INFLUENZA VACCINE  Completed   HIV Screening  Completed   HPV VACCINES  Aged Out     ----------------------------------------------------------------------------------------------------------------------------------------------------------------------------------------------------------------- Physical Exam BP (!) 145/89 (BP Location: Right Wrist, Patient Position: Sitting, Cuff Size: Normal)   Pulse 76   Ht '5\' 7"'$  (1.702 m)   Wt 278 lb (126.1 kg)   SpO2 99%   BMI 43.54 kg/m   Physical Exam Constitutional:      Appearance: Normal appearance.  HENT:     Head: Normocephalic and atraumatic.  Eyes:     General: No scleral icterus. Cardiovascular:     Rate and Rhythm: Normal rate and regular rhythm.  Pulmonary:  Effort: Pulmonary effort is normal.     Breath sounds: Normal breath sounds.  Musculoskeletal:     Comments: Patient was of the right shoulder is normal in abduction and flexion.  He has increased pain with external rotation.  Negative impingement signs.  Neurological:     Mental Status: She is alert.  Psychiatric:        Mood and Affect: Mood normal.     ------------------------------------------------------------------------------------------------------------------------------------------------------------------------------------------------------------------- Assessment and Plan  Rotator cuff tendinitis, right Adding course of prednisone.  Given handout for home  exercises.  If not improving will have her return for injection.  Depression with anxiety Start Lexapro 5 mg x 1 week with increase to 10 mg thereafter.  Encouraged to look into EAP program for counseling.  Hydroxyzine added as needed for severe anxiety and/or difficulty sleeping.   Meds ordered this encounter  Medications   escitalopram (LEXAPRO) 10 MG tablet    Sig: Take 1/2 tab po daily x7 days then increase to 1 tab daily    Dispense:  90 tablet    Refill:  0   hydrOXYzine (VISTARIL) 25 MG capsule    Sig: Take 1 capsule (25 mg total) by mouth 3 (three) times daily as needed for anxiety.    Dispense:  30 capsule    Refill:  0   predniSONE (DELTASONE) 50 MG tablet    Sig: Take '50mg'$  daily x5 days.    Dispense:  5 tablet    Refill:  0    Return in about 4 weeks (around 05/20/2022) for Anxiety.    This visit occurred during the SARS-CoV-2 public health emergency.  Safety protocols were in place, including screening questions prior to the visit, additional usage of staff PPE, and extensive cleaning of exam room while observing appropriate contact time as indicated for disinfecting solutions.

## 2022-04-25 NOTE — Assessment & Plan Note (Signed)
Start Lexapro 5 mg x 1 week with increase to 10 mg thereafter.  Encouraged to look into EAP program for counseling.  Hydroxyzine added as needed for severe anxiety and/or difficulty sleeping.

## 2022-04-25 NOTE — Assessment & Plan Note (Signed)
Adding course of prednisone.  Given handout for home exercises.  If not improving will have her return for injection.

## 2022-05-20 ENCOUNTER — Ambulatory Visit: Payer: BC Managed Care – PPO | Admitting: Family Medicine

## 2022-05-26 ENCOUNTER — Other Ambulatory Visit: Payer: Self-pay | Admitting: Family Medicine

## 2022-06-02 ENCOUNTER — Ambulatory Visit: Payer: BC Managed Care – PPO | Admitting: Family Medicine

## 2022-06-02 ENCOUNTER — Encounter: Payer: Self-pay | Admitting: Family Medicine

## 2022-06-02 VITALS — BP 126/83 | HR 71 | Ht 67.0 in | Wt 277.0 lb

## 2022-06-02 DIAGNOSIS — F418 Other specified anxiety disorders: Secondary | ICD-10-CM | POA: Diagnosis not present

## 2022-06-02 MED ORDER — BUSPIRONE HCL 10 MG PO TABS: 5.0000 mg | ORAL_TABLET | Freq: Two times a day (BID) | ORAL | 3 refills | Status: DC | PRN

## 2022-06-02 NOTE — Assessment & Plan Note (Addendum)
Will plan to continue Lexapro at current strength.  Discontinue hydroxyzine as this did not seem to be effective for her.  Adding BuSpar 5 to 10 mg twice daily as needed.  Encouraged her to see her therapist again and reincorporate regular exercise.  Will plan to follow-up in about 8 weeks.

## 2022-06-02 NOTE — Progress Notes (Signed)
Carrie Gibson - 47 y.o. female MRN [id number]  Date of birth: [birth year]  Subjective Chief Complaint  Patient presents with   Anxiety    HPI Carrie Gibson is a 47 year old female here today for follow-up visit.  At last visit she reported increased anxiety with some sleeping difficulty as well.  Started on Lexapro with hydroxyzine as needed.  Reports that Lexapro seems to be helpful.  Hydroxyzine did not really have any effect for her.  She has not seen her therapist in a few weeks.  She does plan to restart this.  ROS:  A comprehensive ROS was completed and negative except as noted per HPI  Allergies  Allergen Reactions   Topamax [Topiramate] Other (See Comments)    Bad paresthesia     Past Medical History:  Diagnosis Date   Asthma    Environmental allergies    Morbid obesity ([place])     Past Surgical History:  Procedure Laterality Date   CESAREAN SECTION N/A 01/11/2015   Procedure: CESAREAN SECTION;  Surgeon: Emily Filbert, MD;  Location: [facility];  Service: Obstetrics;  Laterality: N/A;   FOOT SURGERY  1995    Social History   Socioeconomic History   Marital status: Married    Spouse name: Not on file   Number of children: Not on file   Years of education: Not on file   Highest education level: Not on file  Occupational History   Not on file  Tobacco Use   Smoking status: Former   Smokeless tobacco: Never  Vaping Use   Vaping Use: Never used  Substance and Sexual Activity   Alcohol use: No    Alcohol/week: 0.0 standard drinks of alcohol   Drug use: No   Sexual activity: Yes    Partners: Male  Other Topics Concern   Not on file  Social History Narrative   Not on file   Social Determinants of Health   Financial Resource Strain: Not on file  Food Insecurity: Not on file  Transportation Needs: Not on file  Physical Activity: Not on file  Stress: Not on file  Social Connections: Not on file    Family History  Problem Relation Age of Onset   Fibroids Mother     Mental illness Father    Fibroids Sister     Health Maintenance  Topic Date Due   COVID-19 Vaccine (4 - 2023-24 season) 06/18/2022 (Originally 12/11/2021)   PAP SMEAR-Modifier  07/05/2022 (Originally 06/08/2019)   Hepatitis C Screening  04/23/2023 (Originally 07/19/1995)   DTaP/Tdap/Td (3 - Td or Tdap) 10/27/2024   INFLUENZA VACCINE  Completed   HIV Screening  Completed   HPV VACCINES  Aged Out     ----------------------------------------------------------------------------------------------------------------------------------------------------------------------------------------------------------------- Physical Exam BP 126/83 (BP Location: Left Arm, Patient Position: Sitting, Cuff Size: Large)   Pulse 71   Ht 5' 7"$  (1.702 m)   Wt 277 lb (125.6 kg)   SpO2 97%   BMI 43.38 kg/m   Physical Exam Constitutional:      Appearance: Normal appearance.  Neurological:     Mental Status: She is alert.  Psychiatric:        Mood and Affect: Mood normal.        Behavior: Behavior normal.     ------------------------------------------------------------------------------------------------------------------------------------------------------------------------------------------------------------------- Assessment and Plan  Depression with anxiety Will plan to continue Lexapro at current strength.  Discontinue hydroxyzine as this did not seem to be effective for her.  Adding BuSpar 5 to 10 mg twice daily as needed.  Encouraged her to see her therapist again and reincorporate regular exercise.  Will plan to follow-up in about 8 weeks.   Meds ordered this encounter  Medications   busPIRone (BUSPAR) 10 MG tablet    Sig: Take 0.5-1 tablets (5-10 mg total) by mouth 2 (two) times daily as needed (Anxiety).    Dispense:  60 tablet    Refill:  3    Return in about 8 weeks (around 07/28/2022) for F/u Anxiety.    This visit occurred during the SARS-CoV-2 public health emergency.  Safety  protocols were in place, including screening questions prior to the visit, additional usage of staff PPE, and extensive cleaning of exam room while observing appropriate contact time as indicated for disinfecting solutions.

## 2022-06-28 ENCOUNTER — Other Ambulatory Visit: Payer: Self-pay | Admitting: Family Medicine

## 2022-07-13 ENCOUNTER — Other Ambulatory Visit: Payer: Self-pay | Admitting: Family Medicine

## 2022-07-28 ENCOUNTER — Ambulatory Visit: Payer: BC Managed Care – PPO | Admitting: Family Medicine

## 2022-07-28 ENCOUNTER — Encounter: Payer: Self-pay | Admitting: Family Medicine

## 2022-07-28 VITALS — BP 112/76 | HR 70 | Ht 67.0 in | Wt 279.0 lb

## 2022-07-28 DIAGNOSIS — G43009 Migraine without aura, not intractable, without status migrainosus: Secondary | ICD-10-CM | POA: Diagnosis not present

## 2022-07-28 DIAGNOSIS — G4700 Insomnia, unspecified: Secondary | ICD-10-CM | POA: Insufficient documentation

## 2022-07-28 DIAGNOSIS — F418 Other specified anxiety disorders: Secondary | ICD-10-CM | POA: Diagnosis not present

## 2022-07-28 DIAGNOSIS — J4541 Moderate persistent asthma with (acute) exacerbation: Secondary | ICD-10-CM

## 2022-07-28 MED ORDER — PREDNISONE 50 MG PO TABS: ORAL_TABLET | ORAL | 0 refills | Status: DC

## 2022-07-28 NOTE — Patient Instructions (Signed)
You can try magnesium glycinate to help with sleep -  daily.  This does take a few weeks to become effective.

## 2022-07-28 NOTE — Assessment & Plan Note (Addendum)
Doing well with Lexapro at current strength.  Continue BuSpar at current strength.

## 2022-07-28 NOTE — Progress Notes (Signed)
Antasia Koogler - 47 y.o. female MRN [id number]  Date of birth: [birth year]  Subjective Chief Complaint  Patient presents with   Anxiety    HPI Raidyn Delcid is a 47 year old female here today for follow-up visit.  She reports that she is doing pretty well.  She is still waiting for an appointment with her therapist.  She continues on Lexapro and buspirone for management of her anxiety.  This continues to work well for her.  She denies any side effects at this time.  She is having some difficulty with sleep and is using melatonin to help with this.  Recent issues with allergies.  She is taking antihistamine as well as Flonase.  She is needing her inhaler a little more often over the past week.  Migraines are stable at this time.  ROS:  A comprehensive ROS was completed and negative except as noted per HPI  Allergies  Allergen Reactions   Topamax [Topiramate] Other (See Comments)    Bad paresthesia     Past Medical History:  Diagnosis Date   Asthma    Environmental allergies    Morbid obesity     Past Surgical History:  Procedure Laterality Date   CESAREAN SECTION N/A 01/11/2015   Procedure: CESAREAN SECTION;  Surgeon: Allie Bossier, MD;  Location: [facility];  Service: Obstetrics;  Laterality: N/A;   FOOT SURGERY  1995    Social History   Socioeconomic History   Marital status: Married    Spouse name: Not on file   Number of children: Not on file   Years of education: Not on file   Highest education level: Not on file  Occupational History   Not on file  Tobacco Use   Smoking status: Former   Smokeless tobacco: Never  Vaping Use   Vaping Use: Never used  Substance and Sexual Activity   Alcohol use: No    Alcohol/week: 0.0 standard drinks of alcohol   Drug use: No   Sexual activity: Yes    Partners: Male  Other Topics Concern   Not on file  Social History Narrative   Not on file   Social Determinants of Health   Financial Resource Strain: Not on file  Food Insecurity:  Not on file  Transportation Needs: Not on file  Physical Activity: Not on file  Stress: Not on file  Social Connections: Not on file    Family History  Problem Relation Age of Onset   Fibroids Mother    Mental illness Father    Fibroids Sister     Health Maintenance  Topic Date Due   PAP SMEAR-Modifier  06/08/2019   COVID-19 Vaccine (4 - 2023-24 season) 12/11/2021   COLONOSCOPY (Pts 45-36yrs Insurance coverage will need to be confirmed)  Never done   Hepatitis C Screening  04/23/2023 (Originally 07/19/1995)   INFLUENZA VACCINE  11/11/2022   DTaP/Tdap/Td (3 - Td or Tdap) 10/27/2024   HIV Screening  Completed   HPV VACCINES  Aged Out     ----------------------------------------------------------------------------------------------------------------------------------------------------------------------------------------------------------------- Physical Exam BP 112/76 (BP Location: Left Arm, Patient Position: Sitting, Cuff Size: Large)   Pulse 70   Ht  (1.702 m)   Wt 279 lb (126.6 kg)   SpO2 98%   BMI 43.70 kg/m   Physical Exam Constitutional:      Appearance: Normal appearance.  HENT:     Head: Normocephalic and atraumatic.  Eyes:     General: No scleral icterus. Cardiovascular:     Rate and Rhythm:  Normal rate and regular rhythm.  Pulmonary:     Effort: Pulmonary effort is normal.     Breath sounds: Normal breath sounds.  Musculoskeletal:     Cervical back: Neck supple.  Neurological:     Mental Status: She is alert.  Psychiatric:        Mood and Affect: Mood normal.        Behavior: Behavior normal.     ------------------------------------------------------------------------------------------------------------------------------------------------------------------------------------------------------------------- Assessment and Plan  Migraine headache Continue propranolol for prevention with Imitrex as needed for breakthrough.  Moderate persistent  asthma with exacerbation She is getting her inhaler more frequently due to allergen exposure.  Adding burst of prednisone.  Depression with anxiety Doing well with Lexapro at current strength.  Continue BuSpar at current strength.  Insomnia She has tried melatonin.  Recommend trial of magnesium supplementation she does have history of migraines as well.   Meds ordered this encounter  Medications   predniSONE (DELTASONE) 50 MG tablet    Sig: Take  daily x5 days.    Dispense:  5 tablet    Refill:  0    No follow-ups on file.    This visit occurred during the SARS-CoV-2 public health emergency.  Safety protocols were in place, including screening questions prior to the visit, additional usage of staff PPE, and extensive cleaning of exam room while observing appropriate contact time as indicated for disinfecting solutions.

## 2022-07-28 NOTE — Assessment & Plan Note (Signed)
Continue propranolol for prevention with Imitrex as needed for breakthrough.

## 2022-07-28 NOTE — Assessment & Plan Note (Signed)
She has tried melatonin.  Recommend trial of magnesium supplementation she does have history of migraines as well.

## 2022-07-28 NOTE — Assessment & Plan Note (Signed)
She is getting her inhaler more frequently due to allergen exposure.  Adding burst of prednisone.

## 2022-08-03 ENCOUNTER — Other Ambulatory Visit: Payer: Self-pay | Admitting: Family Medicine

## 2022-08-24 ENCOUNTER — Other Ambulatory Visit: Payer: Self-pay | Admitting: Family Medicine

## 2022-09-17 ENCOUNTER — Other Ambulatory Visit: Payer: Self-pay | Admitting: Family Medicine

## 2022-09-20 ENCOUNTER — Other Ambulatory Visit: Payer: Self-pay | Admitting: Family Medicine

## 2022-09-20 NOTE — Telephone Encounter (Signed)
Can we see what is on her formulary? 

## 2022-12-14 ENCOUNTER — Other Ambulatory Visit: Payer: Self-pay | Admitting: Family Medicine

## 2022-12-17 ENCOUNTER — Other Ambulatory Visit: Payer: Self-pay | Admitting: Family Medicine

## 2023-01-06 ENCOUNTER — Ambulatory Visit: Payer: BC Managed Care – PPO | Admitting: Family Medicine

## 2023-01-06 ENCOUNTER — Encounter: Payer: Self-pay | Admitting: Family Medicine

## 2023-01-06 VITALS — BP 129/82 | HR 62 | Ht 67.0 in | Wt 285.0 lb

## 2023-01-06 DIAGNOSIS — J4540 Moderate persistent asthma, uncomplicated: Secondary | ICD-10-CM

## 2023-01-06 DIAGNOSIS — G43009 Migraine without aura, not intractable, without status migrainosus: Secondary | ICD-10-CM | POA: Diagnosis not present

## 2023-01-06 MED ORDER — DEXAMETHASONE SODIUM PHOSPHATE 10 MG/ML IJ SOLN
10.0000 mg | Freq: Once | INTRAMUSCULAR | Status: AC
Start: 2023-01-06 — End: 2023-01-06
  Administered 2023-01-06: 10 mg via INTRAMUSCULAR

## 2023-01-06 MED ORDER — TRELEGY ELLIPTA 200-62.5-25 MCG/ACT IN AEPB: INHALATION_SPRAY | RESPIRATORY_TRACT | Status: DC

## 2023-01-06 MED ORDER — ONDANSETRON HCL 4 MG PO TABS: 4.0000 mg | ORAL_TABLET | Freq: Three times a day (TID) | ORAL | 0 refills | Status: AC | PRN

## 2023-01-06 MED ORDER — AIRSUPRA 90-80 MCG/ACT IN AERO: 2.0000 | INHALATION_SPRAY | Freq: Four times a day (QID) | RESPIRATORY_TRACT | 3 refills | Status: DC | PRN

## 2023-01-06 MED ORDER — KETOROLAC TROMETHAMINE 30 MG/ML IJ SOLN
30.0000 mg | Freq: Once | INTRAMUSCULAR | Status: AC
Start: 2023-01-06 — End: 2023-01-06
  Administered 2023-01-06: 30 mg via INTRAMUSCULAR

## 2023-01-06 NOTE — Progress Notes (Signed)
Carrie Gibson - 47 y.o. female MRN [id number]  Date of birth: 1975-07-05  Subjective Chief Complaint  Patient presents with   Migraine    HPI Carrie Gibson is a 47 y.o. female here today for follow up.   She reports that she is having a migraine today.  She has headache with nausea and light sensitivity.  Typical of other migraines.  She has not tried her imitrex today  She feels that her asthma is not fully controlled. She is using advair daily but needing albuterol more often.  She Is not feeling more shortness of breath today.    She is interested in help with weight loss.  She is interested in seeing a dietitian   ROS:  A comprehensive ROS was completed and negative except as noted per HPI    Allergies  Allergen Reactions   Topamax [Topiramate] Other (See Comments)    Bad paresthesia     Past Medical History:  Diagnosis Date   Asthma    Environmental allergies    Morbid obesity (HCC)     Past Surgical History:  Procedure Laterality Date   CESAREAN SECTION N/A 01/11/2015   Procedure: CESAREAN SECTION;  Surgeon: Allie Bossier, MD;  Location: [facility];  Service: Obstetrics;  Laterality: N/A;   FOOT SURGERY  1995    Social History   Socioeconomic History   Marital status: Married    Spouse name: Not on file   Number of children: Not on file   Years of education: Not on file   Highest education level: Master's degree (e.g., MA, MS, MEng, MEd, MSW, MBA)  Occupational History   Not on file  Tobacco Use   Smoking status: Former   Smokeless tobacco: Never  Vaping Use   Vaping status: Never Used  Substance and Sexual Activity   Alcohol use: No    Alcohol/week: 0.0 standard drinks of alcohol   Drug use: No   Sexual activity: Yes    Partners: Male  Other Topics Concern   Not on file  Social History Narrative   Not on file   Social Determinants of Health   Financial Resource Strain: Low Risk  (01/05/2023)   Overall Financial Resource Strain (CARDIA)    Difficulty of  Paying Living Expenses: Not very hard  Food Insecurity: No Food Insecurity (01/05/2023)   Hunger Vital Sign    Worried About Running Out of Food in the Last Year: Never true    Ran Out of Food in the Last Year: Never true  Transportation Needs: No Transportation Needs (01/05/2023)   PRAPARE - Administrator, Civil Service (Medical): No    Lack of Transportation (Non-Medical): No  Physical Activity: Insufficiently Active (01/05/2023)   Exercise Vital Sign    Days of Exercise per Week: 3 days    Minutes of Exercise per Session: 20 min  Stress: Stress Concern Present (01/05/2023)   [employer] of Occupational Health - Occupational Stress Questionnaire    Feeling of Stress : To some extent  Social Connections: Moderately Isolated (01/05/2023)   Social Connection and Isolation Panel [NHANES]    Frequency of Communication with Friends and Family: More than three times a week    Frequency of Social Gatherings with Friends and Family: Once a week    Attends Religious Services: Never    Database administrator or Organizations: No    Attends Engineer, structural: Not on file    Marital Status: Married  Family History  Problem Relation Age of Onset   Fibroids Mother    Mental illness Father    Fibroids Sister     Health Maintenance  Topic Date Due   Cervical Cancer Screening (HPV/Pap Cotest)  06/07/2017   COVID-19 Vaccine (4 - 2023-24 season) 01/22/2023 (Originally 12/12/2022)   Hepatitis C Screening  04/23/2023 (Originally 07/19/1995)   INFLUENZA VACCINE  07/11/2023 (Originally 11/11/2022)   Colonoscopy  01/06/2024 (Originally 07/19/2022)   DTaP/Tdap/Td (3 - Td or Tdap) 10/27/2024   HIV Screening  Completed   HPV VACCINES  Aged Out     ----------------------------------------------------------------------------------------------------------------------------------------------------------------------------------------------------------------- Physical Exam BP  129/82 (BP Location: Left Arm, Patient Position: Sitting, Cuff Size: Large)   Pulse 62   Ht 5\' 7"  (1.702 m)   Wt 285 lb (129.3 kg)   SpO2 98%   BMI 44.64 kg/m   Physical Exam Constitutional:      Appearance: Normal appearance.  HENT:     Head: Atraumatic.  Eyes:     General: No scleral icterus. Cardiovascular:     Rate and Rhythm: Normal rate and regular rhythm.  Pulmonary:     Effort: Pulmonary effort is normal.     Breath sounds: Normal breath sounds.  Musculoskeletal:     Cervical back: Neck supple.  Neurological:     General: No focal deficit present.     Mental Status: She is alert.     Cranial Nerves: No cranial nerve deficit.  Psychiatric:        Mood and Affect: Mood normal.        Behavior: Behavior normal.     ------------------------------------------------------------------------------------------------------------------------------------------------------------------------------------------------------------------- Assessment and Plan  Morbid obesity (HCC) Referral made to dietitian.  Migraine headache Given injection of Toradol, Decadron and ODT Zofran.  Asthma Changing Advair to Trelegy.  Changing albuterol to Airsupra.  Follow-up in about 2 months.   Meds ordered this encounter  Medications   Fluticasone-Umeclidin-Vilant (TRELEGY ELLIPTA) 200-62.5-25 MCG/ACT AEPB    Sig: Inhale once per day.  Rinse mouth thoroughly after each dose.  Exp: 01/2024 Lot: [id number]   Albuterol-Budesonide (AIRSUPRA) 90-80 MCG/ACT AERO    Sig: Inhale 2 puffs into the lungs every 6 (six) hours as needed (Wheezing, SOB).    Dispense:  10.7 g    Refill:  3   ondansetron (ZOFRAN) 4 MG tablet    Sig: Take 1 tablet (4 mg total) by mouth every 8 (eight) hours as needed for nausea or vomiting.    Dispense:  20 tablet    Refill:  0   dexamethasone (DECADRON) injection 10 mg   ketorolac (TORADOL) 30 MG/ML injection 30 mg    Return in about 2 months (around 03/08/2023) for  Asthma.    This visit occurred during the SARS-CoV-2 public health emergency.  Safety protocols were in place, including screening questions prior to the visit, additional usage of staff PPE, and extensive cleaning of exam room while observing appropriate contact time as indicated for disinfecting solutions.

## 2023-01-06 NOTE — Assessment & Plan Note (Signed)
Given injection of Toradol, Decadron and ODT Zofran.

## 2023-01-06 NOTE — Assessment & Plan Note (Signed)
Changing Advair to Trelegy.  Changing albuterol to Airsupra.  Follow-up in about 2 months.

## 2023-01-06 NOTE — Assessment & Plan Note (Signed)
Referral made to dietitian.

## 2023-01-06 NOTE — Patient Instructions (Addendum)
Try trelegy-Inhale once daily  Rinse mouth well after each use.  This will replace Advair  Try airsupra to replace albuterol as your rescue inhaler.  See me again in 2 months.

## 2023-01-20 ENCOUNTER — Encounter: Payer: Self-pay | Admitting: Dietician

## 2023-01-20 ENCOUNTER — Encounter: Payer: BC Managed Care – PPO | Attending: Family Medicine | Admitting: Dietician

## 2023-01-20 NOTE — Addendum Note (Signed)
Addended by: Shirlee Limerick A on: 01/20/2023 04:16 PM   Modules accepted: Level of Service

## 2023-01-20 NOTE — Progress Notes (Signed)
Medical Nutrition Therapy  Appointment Start time:  1410  Appointment End time:  15:25  Primary concerns today: weight management, stress eating Referral diagnosis: E66.01 (ICD-10-CM) - Morbid obesity (HCC)   Preferred learning style: auditory, visual, hands on Learning readiness: ready   NUTRITION ASSESSMENT    Clinical Medical Hx:  Past Medical History:  Diagnosis Date   Asthma    Environmental allergies    Morbid obesity (HCC)     Medications:  Current Outpatient Medications:    Albuterol-Budesonide (AIRSUPRA) 90-80 MCG/ACT AERO, Inhale 2 puffs into the lungs every 6 (six) hours as needed (Wheezing, SOB)., Disp: 10.7 g, Rfl: 3   busPIRone (BUSPAR) 10 MG tablet, TAKE 0.5-1 TABLETS (5-10 MG TOTAL) BY MOUTH 2 (TWO) TIMES DAILY AS NEEDED (ANXIETY)., Disp: 180 tablet, Rfl: 0   cetirizine (ZYRTEC) 10 MG tablet, Take 10 mg by mouth daily., Disp: , Rfl:    escitalopram (LEXAPRO) 10 MG tablet, TAKE 1/2 TAB BY MOUTH DAILY FOR 7 DAYS THE INCREASE TO 1 TAB BY MOUTH DAILY, Disp: 90 tablet, Rfl: 0   fluticasone (FLONASE) 50 MCG/ACT nasal spray, APPLY 2 PUMPS DAILY, Disp: 48 mL, Rfl: PRN   Fluticasone-Umeclidin-Vilant (TRELEGY ELLIPTA) 200-62.5-25 MCG/ACT AEPB, Inhale once per day.  Rinse mouth thoroughly after each dose.  Exp: 01/2024 Lot: [id number], Disp: , Rfl:    omeprazole (PRILOSEC) 40 MG capsule, TAKE 1 CAPSULE BY MOUTH EVERY DAY, Disp: 90 capsule, Rfl: 0   ondansetron (ZOFRAN) 4 MG tablet, Take 1 tablet (4 mg total) by mouth every 8 (eight) hours as needed for nausea or vomiting., Disp: 20 tablet, Rfl: 0   propranolol ER (INDERAL LA) 60 MG 24 hr capsule, TAKE 1 CAPSULE BY MOUTH EVERY DAY, Disp: 90 capsule, Rfl: 1   SUMAtriptan (IMITREX) 50 MG tablet, TAKE 1 TAB BY MOUTH EVERY 2 HOURS AS NEEDED FOR MIGRAINE. MAY REPEAT IN 2 HOURS IF HEADACHE PERSISTS, Disp: 30 tablet, Rfl: 12   albuterol (PROVENTIL) (2.5 MG/3ML) 0.083% nebulizer solution, Take 3 mLs (2.5 mg total) by nebulization every 6 (six)  hours as needed for wheezing or shortness of breath. (Patient not taking: Reported on 01/20/2023), Disp: 150 mL, Rfl: 1  Current Facility-Administered Medications:    albuterol (PROVENTIL) (2.5 MG/3ML) 0.083% nebulizer solution 2.5 mg, 2.5 mg, Nebulization, Q4H, Carlis Stable, PA-C, 2.5 mg at 04/13/17 1451   Labs:  Lab Results  Component Value Date   HGBA1C 4.8 03/03/2016   Notable Signs/Symptoms:  Last vitamin D Lab Results  Component Value Date   VD25OH 16 (L) 02/09/2022    No results found for: "VITAMINB12"  Lifestyle & Dietary Hx Pt reports stress is high. Pt reports her child has autism. Pt reports she stress eats and would like to improve. Pt reports in the past "Over the summer" she had more time to participate in physical activity. Pt reports she has textures issues stating avoiding crunchy vegetables. 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 Pt reports she avoids spicy foods and citrus due to acid reflux and states allergies to beef, kiwi, food coloring and imitation crab. Pt would like to use treadmill and infinity hoop for physical activity. All patient questions were answered during this encounter.  Estimated daily fluid intake: 16 oz water or more daily 2*28 oz SF power ade, 12+ oz SF soda mountain dew Supplements: 2000 IU vitamin D, vitamin C, vitamin B 12, fiber gummy, calcium 100 mg daily, melatonin  Sleep: poor  Stress /  self-care: high  Current average weekly physical activity: counts steps ~12,000+steps daily-walks at work (Pt has a Programmer, systems at [employer], treadmill + infinity hoop at home)  24-Hr Dietary Recall First Meal: flap jacked might muffin, diet mountain dew Snack: none Second Meal: squash, red sauce, morning star chicken patty, cheese, kraft mac cheese Snack: fruit snacks with mini muffins with cookies Third Meal: lean cuisine or teriyaki chicken, squash Snack: none  Beverages: mountain dew diet, sprite  zero, water, SF powerade  NUTRITION DIAGNOSIS  NB-1.1 Food and nutrition-related knowledge deficit As related to limited nutrition related education.  As evidenced by Pt report and 24 hour recall.  NUTRITION INTERVENTION  Nutrition education (E-1) on the following topics:  Fruits & Vegetables: Aim to fill half your plate with a variety of fruits and vegetables. They are rich in vitamins, minerals, and fiber, and can help reduce the risk of chronic diseases. Choose a colorful assortment of fruits and vegetables to ensure you get a wide range of nutrients. Grains and Starches: Make at least half of your grain choices whole grains, such as brown rice, whole wheat bread, and oats. Whole grains provide fiber, which aids in digestion and healthy cholesterol levels. Aim for whole forms of starchy vegetables such as potatoes, sweet potatoes, beans, peas, and corn, which are fiber rich and provide many vitamins and minerals.  Protein: Incorporate lean sources of protein, such as poultry, fish, beans, nuts, and seeds, into your meals. Protein is essential for building and repairing tissues, staying full, balancing blood sugar, as well as supporting immune function. Dairy: Include low-fat or fat-free dairy products like milk, yogurt, and cheese in your diet. Dairy foods are excellent sources of calcium and vitamin D, which are crucial for bone health.  Physical Activity: Aim for 60 minutes of physical activity daily as tolerated. Regular physical activity promotes overall health-including helping to reduce risk for heart disease and diabetes, promoting mental health, and helping [place] sleep better.   Handouts Provided Include  Resources for Health and safety inspector List with preferred food selections Theme park manager website    Learning Style & Readiness for Change Teaching method utilized: Visual & Auditory  Demonstrated degree of understanding via: Teach Back  Barriers to learning/adherence to lifestyle  change: time management  Goals Established by Pt 1-Include a snack in the afternoon  Fruit with a protein source 2- Make a Vegetable Soup for half the plate  1/2 plate non starchy vegetable, 1/4 protein, 1/4 starch 3-Take look at this website for picky eaters and information  https://www.[facility]nsatterin[facility].org 4-Treadmill On Wednesday, Thursday start with 5-10 minutes   MONITORING & EVALUATION Dietary intake, weekly physical activity, and body weight   Next Steps  Patient is to implement goals and schedule follow up visit as desired.

## 2023-01-20 NOTE — Patient Instructions (Addendum)
1-Include a snack in the afternoon  Fruit with a protein source 2- Make a Vegetable Soup for half the plate  1/2 plate non starchy vegetable, 1/4 protein, 1/4 starch 3-Take look at this website for picky eaters and information  https://www.[facility]nsatterin[facility].org 4-Treadmill On Wednesday, Thursday start with 5-10 minutes

## 2023-02-02 ENCOUNTER — Other Ambulatory Visit: Payer: Self-pay | Admitting: Family Medicine

## 2023-03-03 ENCOUNTER — Other Ambulatory Visit: Payer: Self-pay | Admitting: Family Medicine

## 2023-03-08 ENCOUNTER — Encounter: Payer: Self-pay | Admitting: Family Medicine

## 2023-03-08 ENCOUNTER — Ambulatory Visit: Payer: BC Managed Care – PPO | Admitting: Family Medicine

## 2023-03-08 VITALS — BP 118/82 | HR 74 | Ht 67.0 in | Wt 284.0 lb

## 2023-03-08 DIAGNOSIS — J4540 Moderate persistent asthma, uncomplicated: Secondary | ICD-10-CM

## 2023-03-08 DIAGNOSIS — F418 Other specified anxiety disorders: Secondary | ICD-10-CM

## 2023-03-08 DIAGNOSIS — Z23 Encounter for immunization: Secondary | ICD-10-CM

## 2023-03-08 MED ORDER — BUDESONIDE-FORMOTEROL FUMARATE 160-4.5 MCG/ACT IN AERO: 2.0000 | INHALATION_SPRAY | Freq: Two times a day (BID) | RESPIRATORY_TRACT | 3 refills | Status: DC

## 2023-03-08 MED ORDER — MONTELUKAST SODIUM 10 MG PO TABS: 10.0000 mg | ORAL_TABLET | Freq: Every day | ORAL | 1 refills | Status: DC

## 2023-03-08 NOTE — Patient Instructions (Addendum)
Stop trelegy.   Start symbicort to replace this.  Add singulair. See me again in 3-4 months.

## 2023-03-13 NOTE — Progress Notes (Signed)
Carrie Gibson - 47 y.o. female MRN [id number]  Date of birth: 07-09-1975  Subjective Chief Complaint  Patient presents with   Asthma    HPI Carrie Gibson is a 47 y.o. female here today for follow-up visit.  Reports that she has trouble with the dry powder inhaler Trelegy.  She is continue with Airsupra as needed.  She has never tried Singulair.  She denies increased wheezing or dyspnea.  Mood is stable with Lexapro and BuSpar.  No side effects to current strength.  ROS:  A comprehensive ROS was completed and negative except as noted per HPI  Allergies  Allergen Reactions   Kiwi Extract Shortness Of Breath   Beef-Derived Products Diarrhea   Topamax [Topiramate] Other (See Comments)    Bad paresthesia     Past Medical History:  Diagnosis Date   Asthma    Environmental allergies    Morbid obesity (HCC)     Past Surgical History:  Procedure Laterality Date   CESAREAN SECTION N/A 01/11/2015   Procedure: CESAREAN SECTION;  Surgeon: Allie Bossier, MD;  Location: [facility];  Service: Obstetrics;  Laterality: N/A;   FOOT SURGERY  1995    Social History   Socioeconomic History   Marital status: Married    Spouse name: Not on file   Number of children: Not on file   Years of education: Not on file   Highest education level: Master's degree (e.g., MA, MS, MEng, MEd, MSW, MBA)  Occupational History   Not on file  Tobacco Use   Smoking status: Former   Smokeless tobacco: Never  Vaping Use   Vaping status: Never Used  Substance and Sexual Activity   Alcohol use: No    Alcohol/week: 0.0 standard drinks of alcohol   Drug use: No   Sexual activity: Yes    Partners: Male  Other Topics Concern   Not on file  Social History Narrative   Not on file   Social Determinants of Health   Financial Resource Strain: Low Risk  (03/04/2023)   Overall Financial Resource Strain (CARDIA)    Difficulty of Paying Living Expenses: Not hard at all  Food Insecurity: No Food Insecurity (03/04/2023)    Hunger Vital Sign    Worried About Running Out of Food in the Last Year: Never true    Ran Out of Food in the Last Year: Never true  Transportation Needs: No Transportation Needs (03/04/2023)   PRAPARE - Administrator, Civil Service (Medical): No    Lack of Transportation (Non-Medical): No  Physical Activity: Insufficiently Active (03/04/2023)   Exercise Vital Sign    Days of Exercise per Week: 2 days    Minutes of Exercise per Session: 60 min  Stress: Stress Concern Present (03/04/2023)   [employer] of Occupational Health - Occupational Stress Questionnaire    Feeling of Stress : To some extent  Social Connections: Moderately Isolated (03/04/2023)   Social Connection and Isolation Panel [NHANES]    Frequency of Communication with Friends and Family: More than three times a week    Frequency of Social Gatherings with Friends and Family: Patient declined    Attends Religious Services: Never    Database administrator or Organizations: No    Attends Engineer, structural: Not on file    Marital Status: Married    Family History  Problem Relation Age of Onset   Fibroids Mother    Mental illness Father    Fibroids Sister  Health Maintenance  Topic Date Due   Cervical Cancer Screening (HPV/Pap Cotest)  06/07/2017   Hepatitis C Screening  04/23/2023 (Originally 07/19/1995)   Colonoscopy  01/06/2024 (Originally 07/19/2022)   DTaP/Tdap/Td (3 - Td or Tdap) 10/27/2024   INFLUENZA VACCINE  Completed   COVID-19 Vaccine  Completed   HIV Screening  Completed   HPV VACCINES  Aged Out     ----------------------------------------------------------------------------------------------------------------------------------------------------------------------------------------------------------------- Physical Exam BP 118/82 (BP Location: Left Arm, Patient Position: Sitting, Cuff Size: Large)   Pulse 74   Ht 5\' 7"  (1.702 m)   Wt 284 lb (128.8 kg)   SpO2 97%   BMI  44.48 kg/m   Physical Exam Constitutional:      Appearance: Normal appearance.  HENT:     Head: Normocephalic and atraumatic.  Eyes:     General: No scleral icterus. Cardiovascular:     Rate and Rhythm: Normal rate and regular rhythm.  Pulmonary:     Effort: Pulmonary effort is normal.     Breath sounds: Normal breath sounds.  Musculoskeletal:     Cervical back: Neck supple.  Neurological:     Mental Status: She is alert.  Psychiatric:        Mood and Affect: Mood normal.        Behavior: Behavior normal.     ------------------------------------------------------------------------------------------------------------------------------------------------------------------------------------------------------------------- Assessment and Plan  Asthma Discontinue Trelegy and add Symbicort.  Continue Airsupra as needed.  Adding Singulair as well.  Depression with anxiety Mood remains stable with combination of Lexapro and BuSpar.  Will plan to continue.   Meds ordered this encounter  Medications   montelukast (SINGULAIR) 10 MG tablet    Sig: Take 1 tablet (10 mg total) by mouth at bedtime.    Dispense:  90 tablet    Refill:  1   budesonide-formoterol (SYMBICORT) 160-4.5 MCG/ACT inhaler    Sig: Inhale 2 puffs into the lungs 2 (two) times daily.    Dispense:  1 each    Refill:  3    Return in about 4 months (around 07/06/2023) for Asthma.    This visit occurred during the SARS-CoV-2 public health emergency.  Safety protocols were in place, including screening questions prior to the visit, additional usage of staff PPE, and extensive cleaning of exam room while observing appropriate contact time as indicated for disinfecting solutions.

## 2023-03-13 NOTE — Assessment & Plan Note (Signed)
Discontinue Trelegy and add Symbicort.  Continue Airsupra as needed.  Adding Singulair as well.

## 2023-03-13 NOTE — Assessment & Plan Note (Signed)
Mood remains stable with combination of Lexapro and BuSpar.  Will plan to continue.

## 2023-03-19 ENCOUNTER — Other Ambulatory Visit: Payer: Self-pay | Admitting: Family Medicine

## 2023-03-23 ENCOUNTER — Ambulatory Visit: Payer: BC Managed Care – PPO | Admitting: Dietician

## 2023-05-02 ENCOUNTER — Ambulatory Visit: Payer: BC Managed Care – PPO | Admitting: Dietician

## 2023-06-04 ENCOUNTER — Other Ambulatory Visit: Payer: Self-pay | Admitting: Family Medicine

## 2023-06-22 ENCOUNTER — Other Ambulatory Visit: Payer: Self-pay | Admitting: Family Medicine

## 2023-07-06 ENCOUNTER — Ambulatory Visit: Payer: BC Managed Care – PPO | Admitting: Family Medicine

## 2023-07-11 ENCOUNTER — Other Ambulatory Visit: Payer: Self-pay | Admitting: Family Medicine

## 2023-08-06 ENCOUNTER — Other Ambulatory Visit: Payer: Self-pay | Admitting: Family Medicine

## 2023-08-12 ENCOUNTER — Ambulatory Visit
Admission: EM | Admit: 2023-08-12 | Discharge: 2023-08-12 | Disposition: A | Discharge status: Home / Self Care | Attending: Family Medicine | Admitting: Family Medicine

## 2023-08-12 ENCOUNTER — Ambulatory Visit (INDEPENDENT_AMBULATORY_CARE_PROVIDER_SITE_OTHER)

## 2023-08-12 ENCOUNTER — Encounter: Payer: Self-pay | Admitting: Emergency Medicine

## 2023-08-12 DIAGNOSIS — S6992XA Unspecified injury of left wrist, hand and finger(s), initial encounter: Secondary | ICD-10-CM | POA: Diagnosis not present

## 2023-08-12 DIAGNOSIS — M79671 Pain in right foot: Secondary | ICD-10-CM

## 2023-08-12 DIAGNOSIS — M25532 Pain in left wrist: Secondary | ICD-10-CM

## 2023-08-12 DIAGNOSIS — S99921A Unspecified injury of right foot, initial encounter: Secondary | ICD-10-CM | POA: Diagnosis not present

## 2023-08-12 DIAGNOSIS — W19XXXA Unspecified fall, initial encounter: Secondary | ICD-10-CM

## 2023-08-12 MED ORDER — CELECOXIB 200 MG PO CAPS: 200.0000 mg | ORAL_CAPSULE | Freq: Every day | ORAL | 0 refills | Status: AC

## 2023-08-12 NOTE — Discharge Instructions (Addendum)
 Advised patient of x-ray results with hardcopy and images provided.  Advised the patient to take medication as directed with food to completion.  Encouraged to increase daily water intake to 64 ounces per day while taking this medication.  Advised if symptoms worsen and/or unresolved please follow-up with your PCP or [facility] for further evaluation.  Contact information provided with his AVS today.

## 2023-08-12 NOTE — ED Triage Notes (Signed)
 Patient states that she slipped on some water this morning, injuring left wrist and right foot.  Patient has taken Excedrin Migraine (due to migraine).

## 2023-08-12 NOTE — ED Provider Notes (Signed)
 [facility]    CSN: [id number] Arrival date & time: 08/12/23  0914      History   Chief Complaint Chief Complaint  Patient presents with   Fall    HPI Carrie Gibson is a 48 y.o. female.   HPI pleasant 48 year old female presents with left wrist pain and right foot pain secondary to fall earlier this morning.  Patient reports slipping on water.  PMH significant for morbid obesity, GERD, and migraine headache  Past Medical History:  Diagnosis Date   Asthma    Environmental allergies    Morbid obesity [facility])     Patient Active Problem List   Diagnosis Date Noted   Insomnia 07/28/2022   Rotator cuff tendinitis, right 04/25/2022   Depression with anxiety 04/25/2022   Well adult exam 01/13/2022   Sinobronchitis 04/19/2021   Allergic rhinitis 08/15/2019   Lumbar strain 02/15/2017   Headache 04/22/2016   Migraine headache 04/22/2016   GERD (gastroesophageal reflux disease) 02/18/2016   Toenail avulsion, initial encounter 02/18/2016   Morbid obesity (HCC)    S/P cesarean section 06/07/2014   Asthma 04/13/2012   History of miscarriage 01/10/2012    Past Surgical History:  Procedure Laterality Date   CESAREAN SECTION N/A 01/11/2015   Procedure: CESAREAN SECTION;  Surgeon: Ana Balling, MD;  Location: [facility];  Service: Obstetrics;  Laterality: N/A;   FOOT SURGERY  1995    OB History     Gravida  1   Para  1   Term  1   Preterm      AB      Living  1      SAB      IAB      Ectopic      Multiple  0   Live Births  1            Home Medications    Prior to Admission medications   Medication Sig Start Date End Date Taking? Authorizing Provider  albuterol  (PROVENTIL ) (2.5 MG/3ML) 0.083% nebulizer solution Take 3 mLs (2.5 mg total) by nebulization every 6 (six) hours as needed for wheezing or shortness of breath. 04/02/21  Yes Adela Holter, DO  Albuterol -Budesonide  (AIRSUPRA ) 90-80 MCG/ACT AERO Inhale 2 puffs into the lungs every 6 (six)  hours as needed (Wheezing, SOB). 01/06/23  Yes Adela Holter, DO  budesonide -formoterol  (SYMBICORT ) 160-4.5 MCG/ACT inhaler INHALE 2 PUFFS INTO THE LUNGS TWICE A DAY 07/11/23  Yes Adela Holter, DO  busPIRone  (BUSPAR ) 10 MG tablet TAKE 0.5-1 TABLETS (5-10 MG TOTAL) BY MOUTH 2 (TWO) TIMES DAILY AS NEEDED (ANXIETY). 06/22/23  Yes Adela Holter, DO  celecoxib (CELEBREX) 200 MG capsule Take 1 capsule (200 mg total) by mouth daily for 15 days. 08/12/23 08/27/23 Yes Leonides Ramp, FNP  cetirizine (ZYRTEC) 10 MG tablet Take 10 mg by mouth daily.   Yes [provider]  escitalopram  (LEXAPRO ) 10 MG tablet TAKE 1/2 TAB BY MOUTH DAILY FOR 7 DAYS THE INCREASE TO 1 TAB BY MOUTH DAILY 06/22/23  Yes Adela Holter, DO  fluticasone  (FLONASE ) 50 MCG/ACT nasal spray APPLY 2 PUMPS DAILY 01/06/21  Yes Adela Holter, DO  montelukast  (SINGULAIR ) 10 MG tablet Take 1 tablet (10 mg total) by mouth at bedtime. 03/08/23  Yes Adela Holter, DO  omeprazole  (PRILOSEC) 40 MG capsule TAKE 1 CAPSULE BY MOUTH EVERY DAY 06/06/23  Yes Adela Holter, DO  ondansetron  (ZOFRAN ) 4 MG tablet Take 1 tablet (4 mg total) by mouth every 8 (eight) hours as  needed for nausea or vomiting. 01/06/23  Yes Adela Holter, DO  propranolol  ER (INDERAL  LA) 60 MG 24 hr capsule TAKE 1 CAPSULE BY MOUTH EVERY DAY 08/09/23  Yes Adela Holter, DO  SUMAtriptan  (IMITREX ) 50 MG tablet TAKE 1 TAB BY MOUTH EVERY 2 HOURS AS NEEDED FOR MIGRAINE. MAY REPEAT IN 2 HOURS IF HEADACHE PERSISTS 08/15/19  Yes Adela Holter, DO    Family History Family History  Problem Relation Age of Onset   Fibroids Mother    Mental illness Father    Fibroids Sister     Social History Social History   Tobacco Use   Smoking status: Former   Smokeless tobacco: Never  Advertising account planner   Vaping status: Never Used  Substance Use Topics   Alcohol use: No    Alcohol/week: 0.0 standard drinks of alcohol   Drug use: No     Allergies   Kiwi extract, Beef-derived drug products, and  Topamax  [topiramate ]   Review of Systems Review of Systems  Musculoskeletal:        Left wrist pain/right foot pain secondary to fall earlier this morning.     Physical Exam Triage Vital Signs ED Triage Vitals  Encounter Vitals Group     BP 08/12/23 1004 130/62     Systolic BP Percentile --      Diastolic BP Percentile --      Pulse Rate 08/12/23 1004 (!) 53     Resp 08/12/23 1004 18     Temp 08/12/23 1004 98.2 F (36.8 C)     Temp Source 08/12/23 1004 Oral     SpO2 08/12/23 1004 100 %     Weight 08/12/23 1006 290 lb (131.5 kg)     Height 08/12/23 1006 5\' 7"  (1.702 m)     Head Circumference --      Peak Flow --      Pain Score 08/12/23 1006 4     Pain Loc --      Pain Education --      Exclude from Growth Chart --    No data found.  Updated Vital Signs BP 130/62 (BP Location: Right Arm)   Pulse (!) 53   Temp 98.2 F (36.8 C) (Oral)   Resp 18   Ht 5\' 7"  (1.702 m)   Wt 290 lb (131.5 kg)   LMP 07/13/2023   SpO2 100%   BMI 45.42 kg/m      Physical Exam Vitals and nursing note reviewed.  Constitutional:      Appearance: Normal appearance. She is obese. She is not ill-appearing.  HENT:     Head: Normocephalic and atraumatic.     Mouth/Throat:     Mouth: Mucous membranes are moist.     Pharynx: Oropharynx is clear. No posterior oropharyngeal erythema.  Eyes:     Extraocular Movements: Extraocular movements intact.     Conjunctiva/sclera: Conjunctivae normal.     Pupils: Pupils are equal, round, and reactive to light.  Cardiovascular:     Rate and Rhythm: Normal rate and regular rhythm.     Pulses: Normal pulses.     Heart sounds: Normal heart sounds.  Pulmonary:     Effort: Pulmonary effort is normal.     Breath sounds: Normal breath sounds. No wheezing, rhonchi or rales.  Musculoskeletal:        General: Normal range of motion.     Cervical back: Normal range of motion and neck supple.     Comments: Left wrist (dorsum over  distal ulna): TTP with mild  soft tissue swelling, limited range of motion with flexion and extension   Right foot (dorsum over first MTP): TTP with moderate soft tissue swelling  Skin:    General: Skin is warm and dry.  Neurological:     General: No focal deficit present.     Mental Status: She is alert and oriented to person, place, and time. Mental status is at baseline.  Psychiatric:        Mood and Affect: Mood normal.        Behavior: Behavior normal.      UC Treatments / Results  Labs (all labs ordered are listed, but only abnormal results are displayed) Labs Reviewed - No data to display  EKG   Radiology DG Foot Complete Right Result Date: 08/12/2023 CLINICAL DATA:  Right foot pain after fall today. EXAM: RIGHT FOOT COMPLETE - 3+ VIEW COMPARISON:  None Available. FINDINGS: There is no evidence of fracture or dislocation. There is no evidence of arthropathy or other focal bone abnormality. Soft tissues are unremarkable. IMPRESSION: Negative. Electronically Signed   By: Rosalene Colon M.D.   On: 08/12/2023 11:48   DG Wrist Complete Left Result Date: 08/12/2023 CLINICAL DATA:  Left wrist pain after fall today. EXAM: LEFT WRIST - COMPLETE 3+ VIEW COMPARISON:  None Available. FINDINGS: There is no evidence of fracture or dislocation. There is no evidence of arthropathy or other focal bone abnormality. Soft tissues are unremarkable. IMPRESSION: Negative. Electronically Signed   By: Rosalene Colon M.D.   On: 08/12/2023 11:46    Procedures Procedures (including critical care time)  Medications Ordered in UC Medications - No data to display  Initial Impression / Assessment and Plan / UC Course  I have reviewed the triage vital signs and the nursing notes.  Pertinent labs & imaging results that were available during my care of the patient were reviewed by me and considered in my medical decision making (see chart for details).     MDM: 1.  Injury of left wrist, initial encounter-left wrist x-ray  results revealed above, Rx'd Celebrex 200 mg capsule: Take 1 capsule daily x 15 days; 2.  Injury of right foot, initial encounter-right foot x-ray results revealed above, Rx'd Celebrex 200 mg capsule: Take 1 capsule daily x 15 days. Advised patient of x-ray results with hardcopy and images provided.  Advised the patient to take medication as directed with food to completion.  Encouraged to increase daily water intake to 64 ounces per day while taking this medication.  Advised if symptoms worsen and/or unresolved please follow-up with your PCP or [facility] for further evaluation.  Contact information provided with his AVS today.  Patient discharged home, hemodynamically stable. Final Clinical Impressions(s) / UC Diagnoses   Final diagnoses:  Injury of left wrist, initial encounter  Fall, initial encounter  Injury of right foot, initial encounter     Discharge Instructions      Advised patient of x-ray results with hardcopy and images provided.  Advised the patient to take medication as directed with food to completion.  Encouraged to increase daily water intake to 64 ounces per day while taking this medication.  Advised if symptoms worsen and/or unresolved please follow-up with your PCP or [facility] for further evaluation.  Contact information provided with his AVS today.     ED Prescriptions     Medication Sig Dispense Auth. Provider   celecoxib (CELEBREX) 200 MG capsule Take 1 capsule (  200 mg total) by mouth daily for 15 days. 15 capsule Tuff Clabo, FNP      PDMP not reviewed this encounter.   Leonides Ramp, FNP 08/12/23 1223

## 2023-08-17 ENCOUNTER — Telehealth: Admitting: Nurse Practitioner

## 2023-08-17 DIAGNOSIS — H6992 Unspecified Eustachian tube disorder, left ear: Secondary | ICD-10-CM | POA: Diagnosis not present

## 2023-08-17 MED ORDER — CETIRIZINE HCL 10 MG PO TABS: 10.0000 mg | ORAL_TABLET | Freq: Every day | ORAL | 1 refills | Status: AC

## 2023-08-17 MED ORDER — FLUTICASONE PROPIONATE 50 MCG/ACT NA SUSP: 2.0000 | Freq: Every day | NASAL | 6 refills | Status: AC

## 2023-08-17 NOTE — Progress Notes (Signed)
 E-Visit for Ear Pain - Eustachian Tube Dysfunction   We are sorry that you are not feeling well. Here is how we plan to help!   Assure you are using all your allergy medicine currently, and add an anti inflammatory agent like ibuprofen  for additional relief. We will send refills of your allergy regimen to assure you have everything you need.   Based on what you have shared with me it looks like you have Eustachian Tube Dysfunction.  Eustachian Tube Dysfunction is a condition where the tubes that connect your middle ears to your upper throat become blocked. This can lead to discomfort, hearing difficulties and a feeling of fullness in your ear. Eustachian tube dysfunction usually resolves itself in a few days. The usual symptoms include: Hearing problems Tinnitus, or ringing in your ears Clicking or popping sounds A feeling of fullness in your ears Pain that mimics an ear infection Dizziness, vertigo or balance problems A "tickling" sensation in your ears  ?Eustachian tube dysfunction symptoms may get worse in higher altitudes. This is called barotrauma, and it can happen while scuba diving, flying in an airplane or driving in the mountains.   What causes eustachian tube dysfunction? Allergies and infections (like the common cold and the flu) are the most common causes of eustachian tube dysfunction. These conditions can cause inflammation and mucus buildup, leading to blockage. GERD, or chronic acid reflux, can also cause ETD. This is because stomach acid can back up into your throat and result in inflammation. As mentioned above, altitude changes can also cause ETD.   What are some common eustachian tube dysfunction treatments? In most cases, treatment isn't necessary because ETD often resolves on its own. However, you might need treatment if your symptoms linger for more than two weeks.    Eustachian tube dysfunction treatment depends on the cause and the severity of your condition.  Treatments may include home remedies, medications or, in severe cases, surgery.     HOME CARE: Sometimes simple home remedies can help with mild cases of eustachian tube dysfunction. To try and clear the blockage, you can: Chew gum. Yawn. Swallow. Try the Valsalva maneuver (breathing out forcefully while closing your mouth and pinching your nostrils). Use a saline spray to clear out nasal passages.  MEDICATIONS: Over-the-counter medications can help if allergies are causing eustachian tube dysfunction. Try antihistamines (like cetirizine or diphenhydramine ) to ease your symptoms. If you have discomfort, pain relievers -- such as acetaminophen  or ibuprofen  -- can help.  Sometimes intranasal glucocorticosteroids (like Flonase  or Nasacort) help.  I have prescribed Fluticasone  50 mcg/spray 2 sprays in each nostril daily for 10-14 days Zyrtec 10 mg daily     GET HELP RIGHT AWAY IF: Fever is over 102.2 degrees. You develop progressive ear pain or hearing loss. Ear symptoms persist longer than 3 days after treatment.  MAKE SURE YOU: Understand these instructions. Will watch your condition. Will get help right away if you are not doing well or get worse.  Thank you for choosing an e-visit.  Your e-visit answers were reviewed by a board certified advanced clinical practitioner to complete your personal care plan. Depending upon the condition, your plan could have included both over the counter or prescription medications.  Please review your pharmacy choice. Make sure the pharmacy is open so you can pick up the prescription now. If there is a problem, you may contact your provider through [employer] and have the prescription routed to another pharmacy.  Your safety is important  to us . If you have drug allergies check your prescription carefully.   For the next 24 hours you can use MyChart to ask questions about today's visit, request a non-urgent call back, or ask for a work or school  excuse. You will get an email with a survey after your eVisit asking about your experience. We would appreciate your feedback. I hope that your e-visit has been valuable and will aid in your recovery.  I spent approximately 5 minutes reviewing the patient's history, current symptoms and coordinating their care today.

## 2023-09-02 ENCOUNTER — Other Ambulatory Visit: Payer: Self-pay | Admitting: Family Medicine

## 2023-09-03 ENCOUNTER — Other Ambulatory Visit: Payer: Self-pay | Admitting: Family Medicine

## 2023-09-03 DIAGNOSIS — K219 Gastro-esophageal reflux disease without esophagitis: Secondary | ICD-10-CM

## 2023-09-18 ENCOUNTER — Other Ambulatory Visit: Payer: Self-pay | Admitting: Family Medicine

## 2023-10-05 ENCOUNTER — Encounter: Payer: Self-pay | Admitting: Family Medicine

## 2023-10-05 ENCOUNTER — Ambulatory Visit: Admitting: Family Medicine

## 2023-10-05 VITALS — BP 114/78 | HR 65 | Ht 67.0 in | Wt 295.0 lb

## 2023-10-05 DIAGNOSIS — N951 Menopausal and female climacteric states: Secondary | ICD-10-CM | POA: Diagnosis not present

## 2023-10-05 DIAGNOSIS — F418 Other specified anxiety disorders: Secondary | ICD-10-CM | POA: Diagnosis not present

## 2023-10-05 DIAGNOSIS — J4540 Moderate persistent asthma, uncomplicated: Secondary | ICD-10-CM | POA: Diagnosis not present

## 2023-10-05 DIAGNOSIS — G43009 Migraine without aura, not intractable, without status migrainosus: Secondary | ICD-10-CM

## 2023-10-05 MED ORDER — PROPRANOLOL HCL ER 80 MG PO CP24: 80.0000 mg | ORAL_CAPSULE | Freq: Every day | ORAL | 1 refills | Status: DC

## 2023-10-05 MED ORDER — SUMATRIPTAN SUCCINATE 50 MG PO TABS: ORAL_TABLET | ORAL | 12 refills | Status: AC

## 2023-10-05 NOTE — Progress Notes (Signed)
 Carrie Gibson - 48 y.o. female MRN [id number]  Date of birth: 07/03/1975  Subjective Chief Complaint  Patient presents with   Asthma    HPI Carrie Gibson is a 48 y.o. female here today for follow up visit.   She reports that she has noted an increase in headaches.  She believes she may be perimenopausal as her menstrual cycles have decreased over the past year.  She has been on propranolol  60 mg daily for prophylaxis of migraines.  She also has Imitrex  as needed.  Mood overall is stable with Lexapro  and BuSpar .  Her husband has been away for the past week so she has felt a little overwhelmed recently.    Asthma stable with current inhalers as well as Singulair .  ROS:  A comprehensive ROS was completed and negative except as noted per HPI  Allergies  Allergen Reactions   Kiwi Extract Shortness Of Breath   Beef-Derived Drug Products Diarrhea   Topamax  [Topiramate ] Other (See Comments)    Bad paresthesia     Past Medical History:  Diagnosis Date   Asthma    Environmental allergies    Morbid obesity (HCC)     Past Surgical History:  Procedure Laterality Date   CESAREAN SECTION N/A 01/11/2015   Procedure: CESAREAN SECTION;  Surgeon: Harland JAYSON Birkenhead, MD;  Location: [facility];  Service: Obstetrics;  Laterality: N/A;   FOOT SURGERY  1995    Social History   Socioeconomic History   Marital status: Married    Spouse name: Not on file   Number of children: Not on file   Years of education: Not on file   Highest education level: Master's degree (e.g., MA, MS, MEng, MEd, MSW, MBA)  Occupational History   Not on file  Tobacco Use   Smoking status: Former   Smokeless tobacco: Never  Vaping Use   Vaping status: Never Used  Substance and Sexual Activity   Alcohol use: No    Alcohol/week: 0.0 standard drinks of alcohol   Drug use: No   Sexual activity: Yes    Partners: Male  Other Topics Concern   Not on file  Social History Narrative   Not on file   Social Drivers of Health    Financial Resource Strain: Medium Risk (10/01/2023)   Overall Financial Resource Strain (CARDIA)    Difficulty of Paying Living Expenses: Somewhat hard  Food Insecurity: No Food Insecurity (10/01/2023)   Hunger Vital Sign    Worried About Running Out of Food in the Last Year: Never true    Ran Out of Food in the Last Year: Never true  Transportation Needs: No Transportation Needs (10/01/2023)   PRAPARE - Administrator, Civil Service (Medical): No    Lack of Transportation (Non-Medical): No  Physical Activity: Insufficiently Active (10/01/2023)   Exercise Vital Sign    Days of Exercise per Week: 3 days    Minutes of Exercise per Session: 30 min  Stress: Stress Concern Present (10/01/2023)   [employer] of Occupational Health - Occupational Stress Questionnaire    Feeling of Stress: To some extent  Social Connections: Moderately Isolated (10/01/2023)   Social Connection and Isolation Panel    Frequency of Communication with Friends and Family: More than three times a week    Frequency of Social Gatherings with Friends and Family: Once a week    Attends Religious Services: Never    Database administrator or Organizations: No    Attends Banker  Meetings: Not on file    Marital Status: Married    Family History  Problem Relation Age of Onset   Fibroids Mother    Mental illness Father    Fibroids Sister     Health Maintenance  Topic Date Due   Hepatitis C Screening  Never done   Pneumococcal Vaccine 19-62 Years old (1 of 2 - PCV) Never done   Hepatitis B Vaccines (1 of 3 - 19+ 3-dose series) Never done   Cervical Cancer Screening (HPV/Pap Cotest)  06/07/2017   Colonoscopy  01/06/2024 (Originally 07/19/2022)   INFLUENZA VACCINE  11/11/2023   DTaP/Tdap/Td (3 - Td or Tdap) 10/27/2024   COVID-19 Vaccine  Completed   HIV Screening  Completed   HPV VACCINES  Aged Out   Meningococcal B Vaccine  Aged Out      ----------------------------------------------------------------------------------------------------------------------------------------------------------------------------------------------------------------- Physical Exam BP 114/78 (BP Location: Left Arm, Patient Position: Sitting, Cuff Size: Large)   Pulse 65   Ht 5' 7 (1.702 m)   Wt 295 lb (133.8 kg)   SpO2 97%   BMI 46.20 kg/m   Physical Exam Constitutional:      Appearance: Normal appearance.   Cardiovascular:     Rate and Rhythm: Normal rate and regular rhythm.  Pulmonary:     Effort: Pulmonary effort is normal.     Breath sounds: Normal breath sounds.   Neurological:     General: No focal deficit present.     Mental Status: She is alert.   Psychiatric:        Mood and Affect: Mood normal.        Behavior: Behavior normal.     ------------------------------------------------------------------------------------------------------------------------------------------------------------------------------------------------------------------- Assessment and Plan  Asthma Doing well with Symbicort  and Singulair .  Continue Airsupra  as needed.  Depression with anxiety Overall symptoms are stable with Lexapro  and BuSpar .  Will plan to continue.   Migraine headache She is having increased frequency of migraines.  Increase propranolol  to 80 mg daily.  Continue Imitrex  as needed.  Perimenopause may be contributing as well, check FSH/LH   Meds ordered this encounter  Medications   propranolol  ER (INDERAL  LA) 80 MG 24 hr capsule    Sig: Take 1 capsule (80 mg total) by mouth daily.    Dispense:  90 capsule    Refill:  1   SUMAtriptan  (IMITREX ) 50 MG tablet    Sig: TAKE 1 TAB BY MOUTH EVERY 2 HOURS AS NEEDED FOR MIGRAINE. MAY REPEAT IN 2 HOURS IF HEADACHE PERSISTS    Dispense:  30 tablet    Refill:  12    Return in about 6 months (around 04/05/2024) for Mood/BH, migraines.

## 2023-10-05 NOTE — Assessment & Plan Note (Signed)
 She is having increased frequency of migraines.  Increase propranolol  to 80 mg daily.  Continue Imitrex  as needed.  Perimenopause may be contributing as well, check FSH/LH

## 2023-10-05 NOTE — Assessment & Plan Note (Signed)
 Overall symptoms are stable with Lexapro  and BuSpar .  Will plan to continue.

## 2023-10-05 NOTE — Assessment & Plan Note (Signed)
 Doing well with Symbicort  and Singulair .  Continue Airsupra  as needed.

## 2023-10-06 LAB — FSH/LH
FSH: 5.1 m[IU]/mL
LH: 14.9 m[IU]/mL

## 2023-10-16 ENCOUNTER — Ambulatory Visit: Payer: Self-pay | Admitting: Family Medicine

## 2023-11-03 ENCOUNTER — Telehealth: Admitting: Physician Assistant

## 2023-11-03 DIAGNOSIS — J208 Acute bronchitis due to other specified organisms: Secondary | ICD-10-CM | POA: Diagnosis not present

## 2023-11-03 DIAGNOSIS — J4541 Moderate persistent asthma with (acute) exacerbation: Secondary | ICD-10-CM

## 2023-11-03 MED ORDER — PREDNISONE 20 MG PO TABS: 40.0000 mg | ORAL_TABLET | Freq: Every day | ORAL | 0 refills | Status: DC

## 2023-11-03 MED ORDER — BENZONATATE 100 MG PO CAPS: 100.0000 mg | ORAL_CAPSULE | Freq: Three times a day (TID) | ORAL | 0 refills | Status: AC | PRN

## 2023-11-03 NOTE — Progress Notes (Signed)
 E-Visit for Cough   We are sorry that you are not feeling well.  Here is how we plan to help!  Based on your presentation I believe you most likely have A cough due to a virus.  This is called viral bronchitis and is best treated by rest, plenty of fluids and control of the cough.  You may use Ibuprofen  or Tylenol  as directed to help your symptoms.  IT seems this is triggering your asthma. Please continue your regular asthma medications. I am adding on a burst of prednisone  to take as directed.   In addition you may use A non-prescription cough medication called Mucinex DM: take 2 tablets every 12 hours. and A prescription cough medication called Tessalon  Perles 100mg . You may take 1-2 capsules every 8 hours as needed for your cough.   From your responses in the eVisit questionnaire you describe inflammation in the upper respiratory tract which is causing a significant cough.  This is commonly called Bronchitis and has four common causes:   Allergies Viral Infections Acid Reflux Bacterial Infection Allergies, viruses and acid reflux are treated by controlling symptoms or eliminating the cause. An example might be a cough caused by taking certain blood pressure medications. You stop the cough by changing the medication. Another example might be a cough caused by acid reflux. Controlling the reflux helps control the cough.  USE OF BRONCHODILATOR (RESCUE) INHALERS: There is a risk from using your bronchodilator too frequently.  The risk is that over-reliance on a medication which only relaxes the muscles surrounding the breathing tubes can reduce the effectiveness of medications prescribed to reduce swelling and congestion of the tubes themselves.  Although you feel brief relief from the bronchodilator inhaler, your asthma may actually be worsening with the tubes becoming more swollen and filled with mucus.  This can delay other crucial treatments, such as oral steroid medications. If you need to  use a bronchodilator inhaler daily, several times per day, you should discuss this with your provider.  There are probably better treatments that could be used to keep your asthma under control.     HOME CARE Only take medications as instructed by your medical team. Complete the entire course of an antibiotic. Drink plenty of fluids and get plenty of rest. Avoid close contacts especially the very young and the elderly Cover your mouth if you cough or cough into your sleeve. Always remember to wash your hands A steam or ultrasonic humidifier can help congestion.   GET HELP RIGHT AWAY IF: You develop worsening fever. You become short of breath You cough up blood. Your symptoms persist after you have completed your treatment plan MAKE SURE YOU  Understand these instructions. Will watch your condition. Will get help right away if you are not doing well or get worse.    Thank you for choosing an e-visit.  Your e-visit answers were reviewed by a board certified advanced clinical practitioner to complete your personal care plan. Depending upon the condition, your plan could have included both over the counter or prescription medications.  Please review your pharmacy choice. Make sure the pharmacy is open so you can pick up prescription now. If there is a problem, you may contact your provider through [employer] and have the prescription routed to another pharmacy.  Your safety is important to us . If you have drug allergies check your prescription carefully.   For the next 24 hours you can use MyChart to ask questions about today's visit, request a  non-urgent call back, or ask for a work or school excuse. You will get an email in the next two days asking about your experience. I hope that your e-visit has been valuable and will speed your recovery.

## 2023-11-03 NOTE — Progress Notes (Signed)
 I have spent 5 minutes in review of e-visit questionnaire, review and updating patient chart, medical decision making and response to patient.   Piedad Climes, PA-C

## 2023-11-03 NOTE — Progress Notes (Signed)
 Message sent to patient requesting further input regarding current symptoms. Awaiting patient response.

## 2023-11-07 ENCOUNTER — Other Ambulatory Visit: Payer: Self-pay | Admitting: Family Medicine

## 2023-12-23 ENCOUNTER — Encounter (INDEPENDENT_AMBULATORY_CARE_PROVIDER_SITE_OTHER): Payer: Self-pay | Admitting: Family Medicine

## 2023-12-23 DIAGNOSIS — J020 Streptococcal pharyngitis: Secondary | ICD-10-CM

## 2023-12-23 MED ORDER — AMOXICILLIN 500 MG PO TABS: 500.0000 mg | ORAL_TABLET | Freq: Two times a day (BID) | ORAL | 0 refills | Status: AC

## 2023-12-23 NOTE — Telephone Encounter (Signed)
 Please see the MyChart message reply(ies) for my assessment and plan.    This patient gave consent for this Medical Advice Message and is aware that it may result in a bill to [employer], as well as the possibility of receiving a bill for a co-payment or deductible. They are an established patient, but are not seeking medical advice exclusively about a problem treated during an in person or video visit in the last seven days. I did not recommend an in person or video visit within seven days of my reply.   Son diagnosed with strep earlier this morning.  She is now symptomatic as well.  Will treat with course of amoxicillin .    I spent a total of 7 minutes cumulative time within 7 days through [employer].  Velma Ku, DO

## 2024-01-07 ENCOUNTER — Other Ambulatory Visit: Payer: Self-pay | Admitting: Family Medicine

## 2024-01-31 ENCOUNTER — Encounter: Payer: Self-pay | Admitting: Family Medicine

## 2024-01-31 ENCOUNTER — Ambulatory Visit: Payer: Self-pay

## 2024-01-31 ENCOUNTER — Ambulatory Visit: Admitting: Family Medicine

## 2024-01-31 ENCOUNTER — Telehealth: Admitting: Physician Assistant

## 2024-01-31 VITALS — BP 117/78 | HR 73 | Temp 98.0°F | Ht 67.0 in | Wt 295.0 lb

## 2024-01-31 DIAGNOSIS — J4541 Moderate persistent asthma with (acute) exacerbation: Secondary | ICD-10-CM

## 2024-01-31 DIAGNOSIS — R0600 Dyspnea, unspecified: Secondary | ICD-10-CM

## 2024-01-31 DIAGNOSIS — J020 Streptococcal pharyngitis: Secondary | ICD-10-CM | POA: Insufficient documentation

## 2024-01-31 MED ORDER — PREDNISONE 20 MG PO TABS: 40.0000 mg | ORAL_TABLET | Freq: Every day | ORAL | 0 refills | Status: AC

## 2024-01-31 MED ORDER — AMOXICILLIN-POT CLAVULANATE 875-125 MG PO TABS: 1.0000 | ORAL_TABLET | Freq: Two times a day (BID) | ORAL | 0 refills | Status: DC

## 2024-01-31 NOTE — Telephone Encounter (Signed)
   FYI Only or Action Required?: FYI only for provider.  Patient was last seen in primary care on 10/05/2023 by Alvia Bring, DO.  Called Nurse Triage reporting Shortness of Breath.  Symptoms began yesterday.  Interventions attempted: Prescription medications: Inhaler.  Symptoms are: gradually worsening.  Triage Disposition: See HCP Within 4 Hours (Or PCP Triage)  Patient/caregiver understands and will follow disposition?: Yes  **Appt. Scheduled for 10/21 **          Copied from CRM #[id number]. Topic: Clinical - Red Word Triage >> Jan 31, 2024  9:03 AM Charlet HERO wrote: Red Word that prompted transfer to Nurse Triage: Patient is stating that she tested positive for strep on Sunday and she is taking amoxiclin yesterday she started having congestion in her chest and now she is having a hard time breathing. Dr Bring Alvia Peals Reason for Disposition  [1] MILD difficulty breathing (e.g., minimal/no SOB at rest, SOB with walking, pulse < 100) AND [2] NEW-onset or WORSE than normal  Answer Assessment - Initial Assessment Questions 1. RESPIRATORY STATUS: Describe your breathing? (e.g., wheezing, shortness of breath, unable to speak, severe coughing)      SOB, using inhaler mild relief noted   2. ONSET: When did this breathing problem begin?      X 1 day   3. PATTERN Does the difficult breathing come and go, or has it been constant since it started?      Intermittent   4. SEVERITY: How bad is your breathing? (e.g., mild, moderate, severe)     Mild- Moderate   5. RECURRENT SYMPTOM: Have you had difficulty breathing before? If Yes, ask: When was the last time? and What happened that time?       Ongoing as patient is asthmatic   6. CARDIAC HISTORY: Do you have any history of heart disease? (e.g., heart attack, angina, bypass surgery, angioplasty)      No   7. LUNG HISTORY: Do you have any history of lung disease?  (e.g., pulmonary embolus, asthma,  emphysema)     asthma  8. CAUSE: What do you think is causing the breathing problem?      Patient has strep throat   9. OTHER SYMPTOMS: Do you have any other symptoms? (e.g., chest pain, cough, dizziness, fever, runny nose)     Wheezing, cough      Patient is stating that she tested positive for strep on Sunday  after being seen in UC.  She is taking Amoxilin, as of yesterday she started having congestion in her chest and now she is having a hard time breathing. She is using her inhalers which is providing some relief. Appt. Scheduled for 10/21 to follow up with PCP. If SOB worsens patient agrees to be seen in the ED.  Protocols used: Breathing Difficulty-A-AH

## 2024-01-31 NOTE — Assessment & Plan Note (Signed)
 Adding prednisone  burst along with augmentin.  Conitnue current inhalers. Red flags reviewed.

## 2024-01-31 NOTE — Patient Instructions (Signed)
 Change amoxicillin  to augmentin.  Start prednisone .  Stay well hydrated.

## 2024-01-31 NOTE — Progress Notes (Signed)
 Carrie Gibson - 48 y.o. female MRN [id number]  Date of birth: 06-Oct-1975  Subjective Chief Complaint  Patient presents with   URI   Sore Throat    HPI Carrie Gibson is a 48 y.o. female here today with complaint of sore throat, cough, congestion and fatigue.  She was seen at [facility] on 10/19 with dx of strep pharyngitis.  Rx for amoxicillin .  She reports that cough and congestion started yesterday.  She does have asthma and has noticed wheezing at times. She denies body aches, nausea, vomiting, diarrhea, headache.   ROS:  A comprehensive ROS was completed and negative except as noted per HPI  Allergies  Allergen Reactions   Kiwi Extract Shortness Of Breath   Bovine (Beef) Protein-Containing Drug Products Diarrhea   Topamax  [Topiramate ] Other (See Comments)    Bad paresthesia     Past Medical History:  Diagnosis Date   Asthma    Environmental allergies    Morbid obesity (HCC)     Past Surgical History:  Procedure Laterality Date   CESAREAN SECTION N/A 01/11/2015   Procedure: CESAREAN SECTION;  Surgeon: Harland JAYSON Birkenhead, MD;  Location: [facility];  Service: Obstetrics;  Laterality: N/A;   FOOT SURGERY  1995    Social History   Socioeconomic History   Marital status: Married    Spouse name: Not on file   Number of children: Not on file   Years of education: Not on file   Highest education level: Master's degree (e.g., MA, MS, MEng, MEd, MSW, MBA)  Occupational History   Not on file  Tobacco Use   Smoking status: Former   Smokeless tobacco: Never  Vaping Use   Vaping status: Never Used  Substance and Sexual Activity   Alcohol use: No    Alcohol/week: 0.0 standard drinks of alcohol   Drug use: No   Sexual activity: Yes    Partners: Male  Other Topics Concern   Not on file  Social History Narrative   Not on file   Social Drivers of Health   Financial Resource Strain: Medium Risk (10/01/2023)   Overall Financial Resource Strain (CARDIA)    Difficulty of Paying Living Expenses:  Somewhat hard  Food Insecurity: No Food Insecurity (10/01/2023)   Hunger Vital Sign    Worried About Running Out of Food in the Last Year: Never true    Ran Out of Food in the Last Year: Never true  Transportation Needs: No Transportation Needs (10/01/2023)   PRAPARE - Administrator, Civil Service (Medical): No    Lack of Transportation (Non-Medical): No  Physical Activity: Insufficiently Active (10/01/2023)   Exercise Vital Sign    Days of Exercise per Week: 3 days    Minutes of Exercise per Session: 30 min  Stress: Stress Concern Present (10/01/2023)   [employer] of Occupational Health - Occupational Stress Questionnaire    Feeling of Stress: To some extent  Social Connections: Moderately Isolated (10/01/2023)   Social Connection and Isolation Panel    Frequency of Communication with Friends and Family: More than three times a week    Frequency of Social Gatherings with Friends and Family: Once a week    Attends Religious Services: Never    Database administrator or Organizations: No    Attends Engineer, structural: Not on file    Marital Status: Married    Family History  Problem Relation Age of Onset   Fibroids Mother    Mental illness Father  Fibroids Sister     Health Maintenance  Topic Date Due   Hepatitis C Screening  Never done   Pneumococcal Vaccine (1 of 2 - PCV) Never done   Hepatitis B Vaccines 19-59 Average Risk (1 of 3 - 19+ 3-dose series) Never done   Cervical Cancer Screening (HPV/Pap Cotest)  06/07/2017   Mammogram  Never done   Colonoscopy  Never done   Influenza Vaccine  11/11/2023   COVID-19 Vaccine (5 - 2025-26 season) 12/12/2023   DTaP/Tdap/Td (3 - Td or Tdap) 10/27/2024   HIV Screening  Completed   HPV VACCINES  Aged Out   Meningococcal B Vaccine  Aged Out      ----------------------------------------------------------------------------------------------------------------------------------------------------------------------------------------------------------------- Physical Exam BP 117/78 (BP Location: Left Arm, Patient Position: Sitting, Cuff Size: Large)   Pulse 73   Temp 98 F (36.7 C) (Oral)   Ht 5' 7 (1.702 m)   Wt 295 lb (133.8 kg)   SpO2 98%   BMI 46.20 kg/m   Physical Exam Constitutional:      Appearance: She is well-developed.  Eyes:     General: No scleral icterus. Cardiovascular:     Rate and Rhythm: Normal rate and regular rhythm.  Pulmonary:     Effort: Pulmonary effort is normal.     Breath sounds: Normal breath sounds.  Musculoskeletal:     Cervical back: Neck supple.  Neurological:     Mental Status: She is alert.  Psychiatric:        Mood and Affect: Mood normal.        Behavior: Behavior normal.     ------------------------------------------------------------------------------------------------------------------------------------------------------------------------------------------------------------------- Assessment and Plan  Strep pharyngitis Recent positive strep test.  Will change amoxicillin  to augmentin for broader respiratory coverage given her additional symptoms.   Asthma exacerbation Adding prednisone  burst along with augmentin.  Conitnue current inhalers. Red flags reviewed.    Meds ordered this encounter  Medications   amoxicillin -clavulanate (AUGMENTIN) 875-125 MG tablet    Sig: Take 1 tablet by mouth 2 (two) times daily.    Dispense:  20 tablet    Refill:  0   predniSONE  (DELTASONE ) 20 MG tablet    Sig: Take 2 tablets (40 mg total) by mouth daily with breakfast.    Dispense:  10 tablet    Refill:  0    No follow-ups on file.

## 2024-01-31 NOTE — Progress Notes (Signed)
  Because of extreme shortness of breath noted in your questionnaire, I feel your condition warrants further evaluation and I recommend that you be seen in a face-to-face visit.   NOTE: There will be NO CHARGE for this E-Visit   If you are having a true medical emergency, please call 911.     For an urgent face to face visit, [facility] has multiple urgent care centers for your convenience.  Click the link below for the full list of locations and hours, walk-in wait times, appointment scheduling options and driving directions:  Urgent Care - [place], [place], [place], [place], [place], [place]  [place]     Your MyChart E-visit questionnaire answers were reviewed by a board certified advanced clinical practitioner to complete your personal care plan based on your specific symptoms.    Thank you for using e-Visits.

## 2024-01-31 NOTE — Assessment & Plan Note (Signed)
 Recent positive strep test.  Will change amoxicillin  to augmentin for broader respiratory coverage given her additional symptoms.

## 2024-02-20 ENCOUNTER — Encounter: Payer: Self-pay | Admitting: Family Medicine

## 2024-02-21 ENCOUNTER — Ambulatory Visit: Admitting: Physician Assistant

## 2024-02-21 ENCOUNTER — Encounter: Payer: Self-pay | Admitting: Physician Assistant

## 2024-02-21 VITALS — BP 112/69 | HR 62 | Temp 98.6°F | Ht 67.0 in | Wt 269.0 lb

## 2024-02-21 DIAGNOSIS — J029 Acute pharyngitis, unspecified: Secondary | ICD-10-CM

## 2024-02-21 DIAGNOSIS — Z20818 Contact with and (suspected) exposure to other bacterial communicable diseases: Secondary | ICD-10-CM | POA: Diagnosis not present

## 2024-02-21 LAB — POC SOFIA 2 FLU + SARS ANTIGEN FIA
Influenza A, POC: NEGATIVE
Influenza B, POC: NEGATIVE
SARS Coronavirus 2 Ag: NEGATIVE

## 2024-02-21 LAB — POCT RAPID STREP A (OFFICE): Rapid Strep A Screen: NEGATIVE

## 2024-02-21 MED ORDER — AMOXICILLIN-POT CLAVULANATE 875-125 MG PO TABS: 1.0000 | ORAL_TABLET | Freq: Two times a day (BID) | ORAL | 0 refills | Status: DC

## 2024-02-21 NOTE — Telephone Encounter (Signed)
 Patient scheduled.

## 2024-02-21 NOTE — Patient Instructions (Signed)

## 2024-02-21 NOTE — Progress Notes (Signed)
 Acute Office Visit  Subjective:     Patient ID: Carrie Gibson, female    DOB: 11-06-1975, 48 y.o.   MRN: [id number]  Chief Complaint  Patient presents with   Sore Throat    HPI Discussed the use of AI scribe software for clinical note transcription with the patient, who gave verbal consent to proceed.  History of Present Illness Carrie Gibson is a 48 year old female who presents with a sore throat and fever.  Pharyngitis and fever - Sudden onset of sore throat and fever beginning last night - Fever measured at 100F last night - Sore throat is the primary symptom; no other areas affected - No congestion, cough, SOB, CP or nasal discharge - No medication taken this morning - Took Tylenol  at bedtime last night  Exposure history - Son has had recurrent streptococcal pharyngitis infections    ROS See HPI.      Objective:    BP 112/69   Pulse 62   Temp 98.6 F (37 C) (Oral)   Ht 5' 7 (1.702 m)   Wt 269 lb (122 kg)   SpO2 99%   BMI 42.13 kg/m  BP Readings from Last 3 Encounters:  02/21/24 112/69  01/31/24 117/78  10/05/23 114/78   Wt Readings from Last 3 Encounters:  02/21/24 269 lb (122 kg)  01/31/24 295 lb (133.8 kg)  10/05/23 295 lb (133.8 kg)     Physical Exam Constitutional:      Appearance: She is obese. She is ill-appearing.  HENT:     Head: Normocephalic.     Right Ear: Tympanic membrane and ear canal normal.     Left Ear: Tympanic membrane and ear canal normal.     Nose: No congestion or rhinorrhea.     Mouth/Throat:     Mouth: Mucous membranes are moist. No oral lesions.     Pharynx: Uvula midline. Pharyngeal swelling, posterior oropharyngeal erythema and uvula swelling present. No oropharyngeal exudate.     Tonsils: No tonsillar exudate or tonsillar abscesses. 1+ on the right. 1+ on the left.  Eyes:     Conjunctiva/sclera: Conjunctivae normal.  Cardiovascular:     Rate and Rhythm: Normal rate and regular rhythm.  Pulmonary:     Effort:  Pulmonary effort is normal.     Breath sounds: Normal breath sounds.  Neurological:     Mental Status: She is alert.    .. Results for orders placed or performed in visit on 02/21/24  POCT rapid strep A   Collection Time: 02/21/24  1:24 PM  Result Value Ref Range   Rapid Strep A Screen Negative Negative  POC SOFIA 2 FLU + SARS ANTIGEN FIA   Collection Time: 02/21/24  1:24 PM  Result Value Ref Range   Influenza A, POC Negative Negative   Influenza B, POC Negative Negative   SARS Coronavirus 2 Ag Negative Negative        Assessment & Plan:  SABRASABRAAmy was seen today for sore throat.  Diagnoses and all orders for this visit:  Acute pharyngitis, unspecified etiology -     amoxicillin -clavulanate (AUGMENTIN) 875-125 MG tablet; Take 1 tablet by mouth 2 (two) times daily.  Exposure to strep throat -     amoxicillin -clavulanate (AUGMENTIN) 875-125 MG tablet; Take 1 tablet by mouth 2 (two) times daily.   Assessment & Plan Acute pharyngitis Negative strep/flu/covid test but treated based on clinical presentation and exposure history will treat for bacterial strep throat. - augmentin for 7 days -  alternate ibuprofen  and tyleno - encouraged salt water gargles - follow up as needed if symptoms persist or worsen    Return if symptoms worsen or fail to improve.  Jamesmichael Shadd, PA-C

## 2024-03-03 ENCOUNTER — Other Ambulatory Visit: Payer: Self-pay | Admitting: Family Medicine

## 2024-03-25 ENCOUNTER — Other Ambulatory Visit: Payer: Self-pay | Admitting: Family Medicine

## 2024-04-03 ENCOUNTER — Ambulatory Visit: Admitting: Family Medicine

## 2024-04-05 ENCOUNTER — Other Ambulatory Visit: Payer: Self-pay | Admitting: Family Medicine

## 2024-04-09 ENCOUNTER — Other Ambulatory Visit: Payer: Self-pay | Admitting: Family Medicine

## 2024-04-11 NOTE — Telephone Encounter (Signed)
 Called patient left vm to call back to schedule an appt for mood follow up

## 2024-04-13 NOTE — Telephone Encounter (Signed)
 Called patient 2nd call to schedule appointment she is scheduled for 04-19-24

## 2024-04-19 ENCOUNTER — Encounter: Payer: Self-pay | Admitting: Family Medicine

## 2024-04-19 ENCOUNTER — Ambulatory Visit: Admitting: Family Medicine

## 2024-04-19 VITALS — BP 110/80 | HR 79 | Ht 67.0 in | Wt 308.0 lb

## 2024-04-19 DIAGNOSIS — J020 Streptococcal pharyngitis: Secondary | ICD-10-CM | POA: Diagnosis not present

## 2024-04-19 DIAGNOSIS — J4540 Moderate persistent asthma, uncomplicated: Secondary | ICD-10-CM | POA: Insufficient documentation

## 2024-04-19 DIAGNOSIS — F418 Other specified anxiety disorders: Secondary | ICD-10-CM

## 2024-04-19 DIAGNOSIS — J029 Acute pharyngitis, unspecified: Secondary | ICD-10-CM | POA: Diagnosis not present

## 2024-04-19 LAB — POCT RAPID STREP A (OFFICE): Rapid Strep A Screen: POSITIVE — AB

## 2024-04-19 MED ORDER — AMOXICILLIN 500 MG PO CAPS: 500.0000 mg | ORAL_CAPSULE | Freq: Two times a day (BID) | ORAL | 0 refills | Status: AC

## 2024-04-19 NOTE — Assessment & Plan Note (Signed)
 Recent positive strep test.  Treating with amoxicillin .

## 2024-04-19 NOTE — Assessment & Plan Note (Signed)
 Increased anxiety.  She may increase lexapro  to 20mg  daily for now.

## 2024-04-19 NOTE — Assessment & Plan Note (Signed)
 Continue symbicort  and singulair  with airsupra  as needed.

## 2024-04-19 NOTE — Progress Notes (Signed)
 " Carrie Gibson - 49 y.o. female MRN [id number]  Date of birth: 1975-05-09  Subjective Chief Complaint  Patient presents with   Sore Throat    HPI Carrie Gibson is a 49 y.o. female here today for follow up visit.   She reports that she is doing ok.   Complaining of sore throat.  Similar to when she has had strep in the past.  Denies fever or chills.   She has history of asthma.  Doing well with Symbicort  daily and airsupra  as needed. Additionally she remains on singulair .  Stable at this time.    Continues on lexapro .  More anxiety recnetly.  Several deaths in the family over the past year and her son is having tonsillectomy next week.   She would like to try temporarily increasing her lexapro .   ROS:  A comprehensive ROS was completed and negative except as noted per HPI  Allergies[1]  Past Medical History:  Diagnosis Date   Asthma    Environmental allergies    Morbid obesity (HCC)     Past Surgical History:  Procedure Laterality Date   CESAREAN SECTION N/A 01/11/2015   Procedure: CESAREAN SECTION;  Surgeon: Harland JAYSON Birkenhead, MD;  Location: [facility];  Service: Obstetrics;  Laterality: N/A;   FOOT SURGERY  1995    Social History   Socioeconomic History   Marital status: Married    Spouse name: Not on file   Number of children: Not on file   Years of education: Not on file   Highest education level: Master's degree (e.g., MA, MS, MEng, MEd, MSW, MBA)  Occupational History   Not on file  Tobacco Use   Smoking status: Former   Smokeless tobacco: Never  Vaping Use   Vaping status: Never Used  Substance and Sexual Activity   Alcohol use: No    Alcohol/week: 0.0 standard drinks of alcohol   Drug use: No   Sexual activity: Yes    Partners: Male  Other Topics Concern   Not on file  Social History Narrative   Not on file   Social Drivers of Health   Tobacco Use: Medium Risk (04/19/2024)   Patient History    Smoking Tobacco Use: Former    Smokeless Tobacco Use: Never    Passive  Exposure: Not on file  Financial Resource Strain: Low Risk (04/02/2024)   Overall Financial Resource Strain (CARDIA)    Difficulty of Paying Living Expenses: Not very hard  Food Insecurity: No Food Insecurity (04/02/2024)   Epic    Worried About Radiation Protection Practitioner of Food in the Last Year: Never true    Ran Out of Food in the Last Year: Never true  Transportation Needs: No Transportation Needs (04/02/2024)   Epic    Lack of Transportation (Medical): No    Lack of Transportation (Non-Medical): No  Physical Activity: Insufficiently Active (04/02/2024)   Exercise Vital Sign    Days of Exercise per Week: 2 days    Minutes of Exercise per Session: 10 min  Stress: Stress Concern Present (04/02/2024)   [employer] of Occupational Health - Occupational Stress Questionnaire    Feeling of Stress: To some extent  Social Connections: Moderately Isolated (04/02/2024)   Social Connection and Isolation Panel    Frequency of Communication with Friends and Family: More than three times a week    Frequency of Social Gatherings with Friends and Family: Once a week    Attends Religious Services: Never    Production Manager of [employer]  or Organizations: No    Attends Banker Meetings: Not on file    Marital Status: Married  Depression (PHQ2-9): High Risk (10/05/2023)   Depression (PHQ2-9)    PHQ-2 Score: 11  Alcohol Screen: Low Risk (04/02/2024)   Alcohol Screen    Last Alcohol Screening Score (AUDIT): 0  Housing: Low Risk (04/02/2024)   Epic    Unable to Pay for Housing in the Last Year: No    Number of Times Moved in the Last Year: 0    Homeless in the Last Year: No  Utilities: Not on file  Health Literacy: Not on file    Family History  Problem Relation Age of Onset   Fibroids Mother    Mental illness Father    Fibroids Sister     Health Maintenance  Topic Date Due   COVID-19 Vaccine (5 - 2025-26 season) 12/12/2023   Influenza Vaccine  07/10/2024 (Originally 11/11/2023)   Cervical  Cancer Screening (HPV/Pap Cotest)  02/20/2025 (Originally 06/07/2017)   Pneumococcal Vaccine (1 of 2 - PCV) 02/20/2025 (Originally 07/18/1996)   Hepatitis B Vaccines 19-59 Average Risk (1 of 3 - 19+ 3-dose series) 02/20/2025 (Originally 07/18/1996)   Mammogram  02/20/2025 (Originally 07/18/2017)   Colonoscopy  02/20/2025 (Originally 07/19/2022)   Hepatitis C Screening  02/20/2025 (Originally 07/19/1995)   DTaP/Tdap/Td (3 - Td or Tdap) 10/27/2024   HIV Screening  Completed   HPV VACCINES  Aged Out   Meningococcal B Vaccine  Aged Out     ----------------------------------------------------------------------------------------------------------------------------------------------------------------------------------------------------------------- Physical Exam BP 110/80 (BP Location: Left Arm, Patient Position: Sitting, Cuff Size: Large)   Pulse 79   Ht 5' 7 (1.702 m)   Wt (!) 308 lb (139.7 kg)   SpO2 97%   BMI 48.24 kg/m   Physical Exam Constitutional:      Appearance: Normal appearance.  Eyes:     General: No scleral icterus. Cardiovascular:     Rate and Rhythm: Normal rate and regular rhythm.  Pulmonary:     Effort: Pulmonary effort is normal.     Breath sounds: Normal breath sounds.  Lymphadenopathy:     Cervical: Cervical adenopathy present.  Neurological:     Mental Status: She is alert.  Psychiatric:        Mood and Affect: Mood normal.        Behavior: Behavior normal.     ------------------------------------------------------------------------------------------------------------------------------------------------------------------------------------------------------------------- Assessment and Plan  Strep pharyngitis Recent positive strep test.  Treating with amoxicillin .   Depression with anxiety Increased anxiety.  She may increase lexapro  to 20mg  daily for now.    Moderate persistent asthma Continue symbicort  and singulair  with airsupra  as needed.    Meds ordered  this encounter  Medications   amoxicillin  (AMOXIL ) 500 MG capsule    Sig: Take 1 capsule (500 mg total) by mouth 2 (two) times daily for 10 days.    Dispense:  20 capsule    Refill:  0    Return in about 4 months (around 08/17/2024) for Mood/BH.        [1]  Allergies Allergen Reactions   Kiwi Extract Shortness Of Breath   Bovine (Beef) Protein-Containing Drug Products Diarrhea   Topamax  [Topiramate ] Other (See Comments)    Bad paresthesia    "

## 2024-04-20 ENCOUNTER — Encounter: Payer: Self-pay | Admitting: Family Medicine

## 2024-05-06 ENCOUNTER — Other Ambulatory Visit: Payer: Self-pay | Admitting: Family Medicine

## 2024-08-16 ENCOUNTER — Ambulatory Visit: Admitting: Family Medicine
